# Patient Record
Sex: Male | Born: 1990 | Race: Black or African American | Hispanic: No | Marital: Single | Smoking: Never smoker
Health system: Southern US, Community
[De-identification: ages and names within clinical notes are randomized; demographics above are authoritative.]

## PROBLEM LIST (undated history)

## (undated) DIAGNOSIS — K603 Anal fistula, unspecified: Secondary | ICD-10-CM

---

## 2012-11-01 ENCOUNTER — Encounter (HOSPITAL_COMMUNITY): Payer: Self-pay | Admitting: Emergency Medicine

## 2012-11-01 ENCOUNTER — Emergency Department (HOSPITAL_COMMUNITY): Payer: Managed Care, Other (non HMO)

## 2012-11-01 ENCOUNTER — Inpatient Hospital Stay (HOSPITAL_COMMUNITY)
Admission: EM | Admit: 2012-11-01 | Discharge: 2012-11-03 | DRG: 348 | Disposition: A | Discharge status: Home / Self Care | Payer: Managed Care, Other (non HMO) | Attending: General Surgery | Admitting: General Surgery

## 2012-11-01 DIAGNOSIS — K603 Anal fistula, unspecified: Principal | ICD-10-CM

## 2012-11-01 DIAGNOSIS — K612 Anorectal abscess: Secondary | ICD-10-CM | POA: Diagnosis present

## 2012-11-01 LAB — CBC WITH DIFFERENTIAL/PLATELET
Basophils Absolute: 0 10*3/uL (ref 0.0–0.1)
Eosinophils Absolute: 0 10*3/uL (ref 0.0–0.7)
HCT: 38.2 % — ABNORMAL LOW (ref 39.0–52.0)
Hemoglobin: 13.5 g/dL (ref 13.0–17.0)
Lymphs Abs: 1.3 10*3/uL (ref 0.7–4.0)
MCH: 26.4 pg (ref 26.0–34.0)
MCHC: 35.3 g/dL (ref 30.0–36.0)
MCV: 74.8 fL — ABNORMAL LOW (ref 78.0–100.0)
Monocytes Absolute: 0.9 10*3/uL (ref 0.1–1.0)
Monocytes Relative: 8 % (ref 3–12)
Neutro Abs: 9.6 10*3/uL — ABNORMAL HIGH (ref 1.7–7.7)
Platelets: 224 10*3/uL (ref 150–400)
RDW: 13.5 % (ref 11.5–15.5)
WBC: 11.8 10*3/uL — ABNORMAL HIGH (ref 4.0–10.5)

## 2012-11-01 LAB — BASIC METABOLIC PANEL
CO2: 26 mEq/L (ref 19–32)
Calcium: 8.8 mg/dL (ref 8.4–10.5)
Creatinine, Ser: 0.96 mg/dL (ref 0.50–1.35)
GFR calc Af Amer: >90 mL/min (ref >90)
GFR calc non Af Amer: >90 mL/min (ref >90)
Sodium: 137 mEq/L (ref 135–145)

## 2012-11-01 MED ORDER — IOHEXOL 300 MG/ML  SOLN
25.0000 mL | INTRAMUSCULAR | Status: AC
  Administered 2012-11-01: 25 mL via ORAL

## 2012-11-01 MED ORDER — MORPHINE SULFATE 4 MG/ML IJ SOLN
4.0000 mg | Freq: Once | INTRAMUSCULAR | Status: AC
  Administered 2012-11-01: 4 mg via INTRAVENOUS
  Filled 2012-11-01: qty 1

## 2012-11-01 MED ORDER — ONDANSETRON HCL 4 MG/2ML IJ SOLN
4.0000 mg | Freq: Once | INTRAMUSCULAR | Status: AC
  Administered 2012-11-01: 4 mg via INTRAVENOUS
  Filled 2012-11-01: qty 2

## 2012-11-01 MED ORDER — IOHEXOL 300 MG/ML  SOLN
100.0000 mL | Freq: Once | INTRAMUSCULAR | Status: AC | PRN
  Administered 2012-11-01: 100 mL via INTRAVENOUS

## 2012-11-01 NOTE — ED Notes (Signed)
CT notified that pt is finished with contrast.  

## 2012-11-01 NOTE — ED Notes (Signed)
Patient here for complaint of draining wound to buttocks. Wound observed, L inner cheek area. Wound bed is pink with dried white drainage. No active drainage noted, edema present. Patient states he has had this for six months and already got it treated. Patient observed picking at it with his fingers. Pt informed not to touch wound with his own fingers. Patient states he decided to come in today because it was draining down his pants at work.

## 2012-11-01 NOTE — ED Provider Notes (Signed)
CSN: [id number]     Arrival date & time 11/01/12  1906 History  This chart was scribed for Bobby Morn, NP, working with Bobby Houston. Rubin Payor, MD, by Allene Dillon, ED Scribe. This patient was seen in room TR10C/TR10C and the patient's care was started at 8:17 PM.    Chief Complaint  Patient presents with  . Abscess    Patient is a 22 y.o. male presenting with abscess. The history is provided by the patient. No language interpreter was used.  Abscess Location:  Ano-genital Ano-genital abscess location:  Perineum Red streaking: no   Progression:  Worsening Chronicity:  Recurrent Relieved by:  Nothing Worsened by:  Nothing tried Ineffective treatments:  None tried Associated symptoms: no fever, no nausea and no vomiting   Risk factors: prior abscess    HPI Comments: Bobby Houston is a 22 y.o. male who presents to the Emergency Department complaining of abscess at the base of the perineum which has been an ongoing problem for 6 months. Pt has associated abdominal pain. Pt has previously had abscess drained in [place]. Pt has sometimes brown, sometimes clear drainage and bleeding.  Pt denies any other symptoms.     History reviewed. No pertinent past medical history. History reviewed. No pertinent past surgical history. History reviewed. No pertinent family history. History  Substance Use Topics  . Smoking status: Never Smoker   . Smokeless tobacco: Not on file  . Alcohol Use: 14.4 oz/week    24 Cans of beer per week    Review of Systems  Constitutional: Negative for fever and chills.  Gastrointestinal: Negative for nausea and vomiting.  Skin: Positive for wound (abscess near perineum ).  Neurological: Negative for numbness.  All other systems reviewed and are negative.    Allergies  Review of patient's allergies indicates no known allergies.  Home Medications  No current outpatient prescriptions on file.  Triage Vitals: BP 141/91  Pulse 84  Temp(Src) 99.5 F  (37.5 C) (Oral)  Resp 16  SpO2 99% Physical Exam  Nursing note and vitals reviewed. Constitutional: He is oriented to person, place, and time. He appears well-developed and well-nourished. No distress.  HENT:  Head: Normocephalic.  Eyes: EOM are normal.  Neck: Normal range of motion.  Cardiovascular: Normal rate, regular rhythm and normal heart sounds.   Pulmonary/Chest: Effort normal and breath sounds normal. No respiratory distress.  Abdominal: Soft. He exhibits no distension.  Musculoskeletal: Normal range of motion.  Neurological: He is alert and oriented to person, place, and time.  Skin: Skin is warm and dry.  Large indurated area around perineum   Psychiatric: He has a normal mood and affect.    ED Course  Procedures (including critical care time) DIAGNOSTIC STUDIES: Oxygen Saturation is 99% on RA, normal by my interpretation.    COORDINATION OF CARE: 8:20 PM- Pt advised of plan for treatment which includes diagnostics radiology and lab work and pt agrees.  Medications  iohexol (OMNIPAQUE) 300 MG/ML solution 25 mL (25 mLs Oral Contrast Given 11/01/12 2129)  morphine 4 MG/ML injection 4 mg (not administered)  morphine 4 MG/ML injection 4 mg (4 mg Intravenous Given 11/01/12 2101)  ondansetron (ZOFRAN) injection 4 mg (4 mg Intravenous Given 11/01/12 2101)  iohexol (OMNIPAQUE) 300 MG/ML solution 100 mL (100 mLs Intravenous Contrast Given 11/01/12 2321)    Labs Review Labs Reviewed  CBC WITH DIFFERENTIAL - Abnormal; Notable for the following:    WBC 11.8 (*)    HCT 38.2 (*)  MCV 74.8 (*)    Neutrophils Relative % 81 (*)    Lymphocytes Relative 11 (*)    Neutro Abs 9.6 (*)    All other components within normal limits  BASIC METABOLIC PANEL   Imaging Review No results found. CT results reviewed, discussed with Dr. Rubin Payor, shared with patient. 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 Surgery consulted, they will see and admit. MDM    I  personally performed the services described in this documentation, which was scribed in my presence. The recorded information has been reviewed and is accurate.    Jimmye Norman, NP 11/02/12 [phone]

## 2012-11-01 NOTE — ED Notes (Signed)
Phlebotomy called about labs, patient updated on delay.
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
## 2012-11-01 NOTE — ED Notes (Signed)
Patient has been cursing at nursing staff and stated "man yall slow as fuck where is my fucking pain medicine." Patient reassured and delay explained.

## 2012-11-01 NOTE — ED Notes (Signed)
Pt complaint of abscess to anus.

## 2012-11-02 ENCOUNTER — Inpatient Hospital Stay (HOSPITAL_COMMUNITY): Payer: Managed Care, Other (non HMO) | Admitting: Anesthesiology

## 2012-11-02 ENCOUNTER — Encounter (HOSPITAL_COMMUNITY): Admission: EM | Disposition: A | Discharge status: Home / Self Care | Payer: Self-pay | Source: Home / Self Care

## 2012-11-02 ENCOUNTER — Encounter (HOSPITAL_COMMUNITY): Payer: Self-pay | Admitting: Anesthesiology

## 2012-11-02 DIAGNOSIS — K603 Anal fistula: Secondary | ICD-10-CM

## 2012-11-02 DIAGNOSIS — K612 Anorectal abscess: Secondary | ICD-10-CM

## 2012-11-02 HISTORY — PX: IRRIGATION AND DEBRIDEMENT ABSCESS: SHX5252

## 2012-11-02 HISTORY — PX: EXAMINATION UNDER ANESTHESIA: SHX1540

## 2012-11-02 HISTORY — PX: PLACEMENT OF SETON: SHX6029

## 2012-11-02 LAB — SURGICAL PCR SCREEN: MRSA, PCR: NEGATIVE

## 2012-11-02 SURGERY — IRRIGATION AND DEBRIDEMENT ABSCESS
Anesthesia: General | Site: Anus | Wound class: Dirty or Infected

## 2012-11-02 MED ORDER — MUPIROCIN 2 % EX OINT
1.0000 "application " | TOPICAL_OINTMENT | Freq: Two times a day (BID) | CUTANEOUS | Status: DC
  Administered 2012-11-02 – 2012-11-03 (×2): 1 via NASAL
  Filled 2012-11-02 (×2): qty 22

## 2012-11-02 MED ORDER — HYDROMORPHONE HCL PF 1 MG/ML IJ SOLN
INTRAMUSCULAR | Status: AC
  Filled 2012-11-02: qty 1

## 2012-11-02 MED ORDER — BUPIVACAINE-EPINEPHRINE PF 0.25-1:200000 % IJ SOLN
INTRAMUSCULAR | Status: AC
  Filled 2012-11-02: qty 30

## 2012-11-02 MED ORDER — ONDANSETRON HCL 4 MG/2ML IJ SOLN: 4.0000 mg | Freq: Four times a day (QID) | INTRAMUSCULAR | Status: DC | PRN

## 2012-11-02 MED ORDER — HYDROMORPHONE HCL PF 1 MG/ML IJ SOLN
1.0000 mg | INTRAMUSCULAR | Status: DC | PRN
  Administered 2012-11-02 – 2012-11-03 (×2): 1 mg via INTRAVENOUS
  Filled 2012-11-02 (×3): qty 1

## 2012-11-02 MED ORDER — ACETAMINOPHEN 325 MG PO TABS
650.0000 mg | ORAL_TABLET | Freq: Four times a day (QID) | ORAL | Status: DC | PRN
  Administered 2012-11-02: 650 mg via ORAL
  Filled 2012-11-02: qty 2

## 2012-11-02 MED ORDER — SURGILUBE EX GEL
CUTANEOUS | Status: DC | PRN
  Administered 2012-11-02: 1 via TOPICAL

## 2012-11-02 MED ORDER — LACTATED RINGERS IV SOLN
INTRAVENOUS | Status: DC | PRN
  Administered 2012-11-02: 11:00:00 via INTRAVENOUS

## 2012-11-02 MED ORDER — OXYCODONE HCL 5 MG/5ML PO SOLN: 5.0000 mg | Freq: Once | ORAL | Status: DC | PRN

## 2012-11-02 MED ORDER — LIDOCAINE HCL (CARDIAC) 20 MG/ML IV SOLN
INTRAVENOUS | Status: DC | PRN
  Administered 2012-11-02: 100 mg via INTRAVENOUS

## 2012-11-02 MED ORDER — ENOXAPARIN SODIUM 40 MG/0.4ML ~~LOC~~ SOLN
40.0000 mg | SUBCUTANEOUS | Status: DC
  Administered 2012-11-03: 40 mg via SUBCUTANEOUS
  Filled 2012-11-02 (×2): qty 0.4

## 2012-11-02 MED ORDER — SODIUM CHLORIDE 0.9 % IV SOLN
3.0000 g | Freq: Four times a day (QID) | INTRAVENOUS | Status: DC
  Administered 2012-11-02 – 2012-11-03 (×6): 3 g via INTRAVENOUS
  Filled 2012-11-02 (×8): qty 3

## 2012-11-02 MED ORDER — HYDROGEN PEROXIDE 3 % EX SOLN
CUTANEOUS | Status: DC | PRN
  Administered 2012-11-02: 1

## 2012-11-02 MED ORDER — LACTATED RINGERS IV SOLN
INTRAVENOUS | Status: DC
  Administered 2012-11-02: 11:00:00 via INTRAVENOUS

## 2012-11-02 MED ORDER — BUPIVACAINE-EPINEPHRINE 0.25% -1:200000 IJ SOLN
INTRAMUSCULAR | Status: DC | PRN
  Administered 2012-11-02: 10 mL

## 2012-11-02 MED ORDER — HYDROMORPHONE HCL PF 1 MG/ML IJ SOLN
0.2500 mg | INTRAMUSCULAR | Status: DC | PRN
  Administered 2012-11-02 (×4): 0.5 mg via INTRAVENOUS

## 2012-11-02 MED ORDER — HYDROMORPHONE HCL PF 1 MG/ML IJ SOLN
1.0000 mg | INTRAMUSCULAR | Status: DC | PRN
  Administered 2012-11-02 (×3): 1 mg via INTRAVENOUS
  Filled 2012-11-02 (×3): qty 1

## 2012-11-02 MED ORDER — PROPOFOL 10 MG/ML IV BOLUS
INTRAVENOUS | Status: DC | PRN
  Administered 2012-11-02: 200 mg via INTRAVENOUS

## 2012-11-02 MED ORDER — DOCUSATE SODIUM 100 MG PO CAPS
100.0000 mg | ORAL_CAPSULE | Freq: Two times a day (BID) | ORAL | Status: DC
  Administered 2012-11-02 – 2012-11-03 (×2): 100 mg via ORAL
  Filled 2012-11-02 (×2): qty 1

## 2012-11-02 MED ORDER — FENTANYL CITRATE 0.05 MG/ML IJ SOLN
INTRAMUSCULAR | Status: DC | PRN
  Administered 2012-11-02: 50 ug via INTRAVENOUS
  Administered 2012-11-02: 150 ug via INTRAVENOUS

## 2012-11-02 MED ORDER — ACETAMINOPHEN 650 MG RE SUPP: 650.0000 mg | Freq: Four times a day (QID) | RECTAL | Status: DC | PRN

## 2012-11-02 MED ORDER — OXYCODONE HCL 5 MG PO TABS: 5.0000 mg | ORAL_TABLET | Freq: Once | ORAL | Status: DC | PRN

## 2012-11-02 MED ORDER — DEXTROSE-NACL 5-0.9 % IV SOLN
INTRAVENOUS | Status: DC
  Administered 2012-11-02 – 2012-11-03 (×3): via INTRAVENOUS

## 2012-11-02 MED ORDER — OXYCODONE-ACETAMINOPHEN 5-325 MG PO TABS
ORAL_TABLET | ORAL | Status: AC
  Filled 2012-11-02: qty 2

## 2012-11-02 MED ORDER — OXYCODONE-ACETAMINOPHEN 5-325 MG PO TABS
1.0000 | ORAL_TABLET | ORAL | Status: DC | PRN
  Administered 2012-11-02 (×2): 2 via ORAL
  Filled 2012-11-02: qty 2

## 2012-11-02 MED ORDER — ONDANSETRON HCL 4 MG/2ML IJ SOLN
INTRAMUSCULAR | Status: DC | PRN
  Administered 2012-11-02: 4 mg via INTRAVENOUS

## 2012-11-02 MED ORDER — 0.9 % SODIUM CHLORIDE (POUR BTL) OPTIME
TOPICAL | Status: DC | PRN
  Administered 2012-11-02: 1000 mL

## 2012-11-02 MED ORDER — MIDAZOLAM HCL 5 MG/5ML IJ SOLN
INTRAMUSCULAR | Status: DC | PRN
  Administered 2012-11-02: 2 mg via INTRAVENOUS

## 2012-11-02 MED ORDER — PROMETHAZINE HCL 25 MG/ML IJ SOLN: 6.2500 mg | INTRAMUSCULAR | Status: DC | PRN

## 2012-11-02 MED ORDER — ENOXAPARIN SODIUM 40 MG/0.4ML ~~LOC~~ SOLN: 40.0000 mg | SUBCUTANEOUS | Status: DC

## 2012-11-02 MED ORDER — SUCCINYLCHOLINE CHLORIDE 20 MG/ML IJ SOLN
INTRAMUSCULAR | Status: DC | PRN
  Administered 2012-11-02: 100 mg via INTRAVENOUS

## 2012-11-02 MED ORDER — CHLORHEXIDINE GLUCONATE CLOTH 2 % EX PADS
6.0000 | MEDICATED_PAD | Freq: Every day | CUTANEOUS | Status: DC
  Administered 2012-11-02: 6 via TOPICAL

## 2012-11-02 SURGICAL SUPPLY — 41 items
BAG DECANTER FOR FLEXI CONT (MISCELLANEOUS) ×2 IMPLANT
BENZOIN TINCTURE PRP APPL 2/3 (GAUZE/BANDAGES/DRESSINGS) ×2 IMPLANT
BLADE SURG ROTATE 9660 (MISCELLANEOUS) IMPLANT
CANISTER SUCTION 2500CC (MISCELLANEOUS) ×2 IMPLANT
CHLORAPREP W/TINT 26ML (MISCELLANEOUS) ×2 IMPLANT
CLOTH BEACON ORANGE TIMEOUT ST (SAFETY) ×2 IMPLANT
COVER SURGICAL LIGHT HANDLE (MISCELLANEOUS) ×2 IMPLANT
DECANTER SPIKE VIAL GLASS SM (MISCELLANEOUS) ×2 IMPLANT
DRAPE LAPAROTOMY T 102X78X121 (DRAPES) ×4 IMPLANT
DRAPE UTILITY 15X26 W/TAPE STR (DRAPE) ×4 IMPLANT
DRSG PAD ABDOMINAL 8X10 ST (GAUZE/BANDAGES/DRESSINGS) ×2 IMPLANT
ELECT REM PT RETURN 9FT ADLT (ELECTROSURGICAL) ×2
ELECTRODE REM PT RTRN 9FT ADLT (ELECTROSURGICAL) ×1 IMPLANT
GLOVE BIOGEL M STRL SZ7.5 (GLOVE) ×2 IMPLANT
GLOVE BIOGEL PI IND STRL 7.5 (GLOVE) ×1 IMPLANT
GLOVE BIOGEL PI IND STRL 8 (GLOVE) ×1 IMPLANT
GLOVE BIOGEL PI INDICATOR 7.5 (GLOVE) ×1
GLOVE BIOGEL PI INDICATOR 8 (GLOVE) ×1
GLOVE SURG SS PI 7.0 STRL IVOR (GLOVE) ×2 IMPLANT
GOWN STRL NON-REIN LRG LVL3 (GOWN DISPOSABLE) ×2 IMPLANT
GOWN STRL REIN XL XLG (GOWN DISPOSABLE) ×2 IMPLANT
HYDROGEN PEROXIDE 16OZ (MISCELLANEOUS) ×2 IMPLANT
IV CATH 14GX2 1/4 (CATHETERS) ×2 IMPLANT
KIT BASIN OR (CUSTOM PROCEDURE TRAY) ×2 IMPLANT
KIT ROOM TURNOVER OR (KITS) ×2 IMPLANT
LOOP VESSEL MINI RED (MISCELLANEOUS) ×2 IMPLANT
NEEDLE HYPO 25GX1X1/2 BEV (NEEDLE) ×2 IMPLANT
NS IRRIG 1000ML POUR BTL (IV SOLUTION) ×2 IMPLANT
PACK GENERAL/GYN (CUSTOM PROCEDURE TRAY) ×2 IMPLANT
PAD ARMBOARD 7.5X6 YLW CONV (MISCELLANEOUS) ×4 IMPLANT
SPECIMEN JAR SMALL (MISCELLANEOUS) ×2 IMPLANT
SPONGE GAUZE 4X4 12PLY (GAUZE/BANDAGES/DRESSINGS) ×2 IMPLANT
SUT ETHILON 3 0 PS 1 (SUTURE) ×2 IMPLANT
SUT VIC AB 3-0 SH 18 (SUTURE) ×2 IMPLANT
SYR BULB 3OZ (MISCELLANEOUS) ×2 IMPLANT
SYR CONTROL 10ML LL (SYRINGE) ×4 IMPLANT
TAPE CLOTH SURG 6X10 WHT LF (GAUZE/BANDAGES/DRESSINGS) ×2 IMPLANT
TOWEL OR 17X24 6PK STRL BLUE (TOWEL DISPOSABLE) ×2 IMPLANT
TOWEL OR 17X26 10 PK STRL BLUE (TOWEL DISPOSABLE) ×2 IMPLANT
UNDERPAD 30X30 INCONTINENT (UNDERPADS AND DIAPERS) ×2 IMPLANT
WATER STERILE IRR 1000ML POUR (IV SOLUTION) IMPLANT
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
## 2012-11-02 NOTE — Progress Notes (Signed)
Patient arrived to unit via stretcher. Alert and oriented. VSS. No drainage from buttock wound. Pain meds given in ED just before arrival to unit. Oriented to room and surroundings.

## 2012-11-02 NOTE — Preoperative (Signed)
Beta Blockers   Reason not to administer Beta Blockers:Not Applicable 

## 2012-11-02 NOTE — Progress Notes (Signed)
Subjective: Reports less pain this am. Reports 41mo h/o intermittent L buttock pain, swelling with intermittent drainage. BMs generally every other day. No melena/hematochezia, wt loss. Denies h/o IBD. Denies other medical problems  Objective: Vital signs in last 24 hours: Temp:  [98.3 F (36.8 C)-100 F (37.8 C)] 98.3 F (36.8 C) (09/24 0534) Pulse Rate:  [70-89] 70 (09/24 0534) Resp:  [16-18] 16 (09/24 0534) BP: (98-141)/(38-91) 98/38 mmHg (09/24 0534) SpO2:  [96 %-100 %] 99 % (09/24 0534) Weight:  [197 lb 1.5 oz (89.4 kg)] 197 lb 1.5 oz (89.4 kg) (09/24 0220) Last BM Date: 11/01/12  Intake/Output from previous day: 09/23 0701 - 09/24 0700 In: 100 [IV Piggyback:100] Out: -  Intake/Output this shift:    Alert, nad cta  Reg Soft, nt Rectal - no change from Dr Cornett's exam - several openings on inner L buttock with induration,fluctuance. DRE - good tone. Very tender  Lab Results:   Recent Labs  11/01/12 2135  WBC 11.8*  HGB 13.5  HCT 38.2*  PLT 224   BMET  Recent Labs  11/01/12 2135  NA 137  K 3.5  CL 100  CO2 26  GLUCOSE 87  BUN 10  CREATININE 0.96  CALCIUM 8.8   PT/INR No results found for this basename: LABPROT, INR,  in the last 72 hours ABG No results found for this basename: PHART, PCO2, PO2, HCO3,  in the last 72 hours  Studies/Results: Ct Pelvis W Contrast  11/01/2012   CLINICAL DATA:  Recurring perianal abscess. History of the surgical drainage.  EXAM: CT PELVIS WITH CONTRAST  TECHNIQUE: Multidetector CT imaging of the pelvis was performed using the standard protocol following the bolus administration of intravenous contrast.  CONTRAST:  OMNIPAQUE IOHEXOL 300 MG/ML  SOLN  COMPARISON:  None.  FINDINGS: There is a perianal fistula on the left at 5 o'clock with soft tissue stranding in the surrounding subcutaneous fat. There is a small associated fluid collection measuring 2.3 cm maximally. Air extends from the anus to perineal skin surface.  No significant inflammatory changes are present on the right.  The distal rectum and sigmoid colon appear normal. There are no intrapelvic inflammatory changes or fluid collections. The appendix appears normal. There is no pelvic or inguinal lymphadenopathy.  The urinary bladder, prostate gland and seminal vesicles appear normal. There are no significant vascular findings.  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 No intrapelvic inflammatory change is demonstrated.   Electronically Signed   By: Roxy Horseman   On: 11/01/2012 23:59    Anti-infectives: Anti-infectives   Start     Dose/Rate Route Frequency Ordered Stop   11/02/12 0100  Ampicillin-Sulbactam (UNASYN) 3 g in sodium chloride 0.9 % 100 mL IVPB     3 g 100 mL/hr over 60 Minutes Intravenous Every 6 hours 11/02/12 0056        Assessment/Plan: L Perirectal abscess with high suspicion for fistula in ano given clinical history and CT  NPO IVF & IV abx scds rec OR for EUA, I&D, possible seton placement  Discussed perirectal abscess and high suspicion of fistula. Explained we needed to control infection with I&D. Will also evaluate for fistula - if found recommended seton placement. Explained that sometimes fistula may be present but can't ID at time of surgery given degree of inflammation. Discussed risk/benefits of surgery   including but not limited to bleeding, infection, injury to surrounding structures, blood clot formation, urinary retention, anesthesia  risks, pulmonary & cardiac complications, need for additional procedures. I explained that he would most likely need additional procedures in future to repair fistula. I explained that the likelihood of improvement in their symptoms is good.  Mary Sella. Andrey Campanile, MD, FACS General, Bariatric, & Minimally Invasive Surgery [facility] Surgery, [place]   LOS: 1 day    Atilano Ina 11/02/2012

## 2012-11-02 NOTE — Progress Notes (Signed)
Patient had a bowel movement and dressing had come off around 2115. No packing noted inside incision. Dry dressing applied over incision with tape.

## 2012-11-02 NOTE — Anesthesia Preprocedure Evaluation (Signed)
Anesthesia Evaluation  Patient identified by MRN, date of birth, ID band Patient awake    Reviewed: Allergy & Precautions, H&P , NPO status , Patient's Chart, lab work & pertinent test results  Airway Mallampati: I TM Distance: >3 FB Neck ROM: Full    Dental  (+) Teeth Intact and Dental Advisory Given   Pulmonary neg pulmonary ROS,    Pulmonary exam normal       Cardiovascular negative cardio ROS      Neuro/Psych negative neurological ROS  negative psych ROS   GI/Hepatic negative GI ROS, Neg liver ROS,   Endo/Other  negative endocrine ROS  Renal/GU negative Renal ROS  negative genitourinary   Musculoskeletal negative musculoskeletal ROS (+)   Abdominal   Peds negative pediatric ROS (+)  Hematology negative hematology ROS (+)   Anesthesia Other Findings   Reproductive/Obstetrics negative OB ROS                           Anesthesia Physical Anesthesia Plan  ASA: II and emergent  Anesthesia Plan: General   Post-op Pain Management:    Induction: Intravenous  Airway Management Planned: Oral ETT  Additional Equipment:   Intra-op Plan:   Post-operative Plan: Extubation in OR  Informed Consent: I have reviewed the patients History and Physical, chart, labs and discussed the procedure including the risks, benefits and alternatives for the proposed anesthesia with the patient or authorized representative who has indicated his/her understanding and acceptance.   Dental advisory given  Plan Discussed with: CRNA, Anesthesiologist and Surgeon  Anesthesia Plan Comments:         Anesthesia Quick Evaluation

## 2012-11-02 NOTE — Transfer of Care (Signed)
Immediate Anesthesia Transfer of Care Note  Patient: Bobby Houston  Procedure(s) Performed: Procedure(s): IRRIGATION AND DEBRIDEMENT RIGHT BUTTOCK BSCESS (N/A) PLACEMENT OF SETON (N/A) EXAM UNDER ANESTHESIA (N/A)  Patient Location: PACU  Anesthesia Type:General  Level of Consciousness: awake, alert  and oriented  Airway & Oxygen Therapy: Patient Spontanous Breathing and Patient connected to nasal cannula oxygen  Post-op Assessment: Report given to PACU RN and Post -op Vital signs reviewed and stable  Post vital signs: Reviewed and stable  Complications: No apparent anesthesia complications

## 2012-11-02 NOTE — Op Note (Signed)
11/01/2012 - 11/02/2012  3:58 PM  PATIENT:  Bobby Houston  22 y.o. male  PRE-OPERATIVE DIAGNOSIS:  Left buttock abscess, probable fistula in ano  POST-OPERATIVE DIAGNOSIS: Left buttock abscess, fistula in ano  PROCEDURE:  Procedure(s): IRRIGATION AND DEBRIDEMENT RIGHT BUTTOCK ABSCESS (skin and subcutaneous tissue with scalpel) PLACEMENT OF SETON EXAM UNDER ANESTHESIA  SURGEON:  Surgeon(s): Atilano Ina, MD  ASSISTANTS: none   ANESTHESIA:   general  DRAINS: seton   LOCAL MEDICATIONS USED:  MARCAINE     SPECIMEN:  Source of Specimen:  buttock skin and subcu tissue  DISPOSITION OF SPECIMEN:  PATHOLOGY  COUNTS:  YES  INDICATION FOR PROCEDURE: Patient is a 22 year old African American male who reports a six-month history of intermittent perirectal drainage. He states occasionally he will develop tenderness and swelling on his left buttock and it'll start to spontaneously drain. It will drain for several days and then stop. He has had several flares over the past couple months. He states a few days ago the swelling became very intense and painful more so then in the past And he presented to the emergency room. He underwent CT scan which demonstrated a left buttock abscess as well as a probable fistula on the left side. I discussed with the patient going to the operating room for exam under anesthesia, debrider the buttock abscess and to evaluate for a potential fistula. Based on his clinical history it was very clear that he probably has a fistula in ano. I explained that if I was able to demonstrate it I would recommend placing a seton. I explained that sometimes in the acute inflamed state it may not be evident on today's exam. We discussed the risk and benefits of surgery including but not limited to bleeding, infection, injury to surrounding structures, urinary retention, blood clot formation, need for additional procedures, recurrence, anesthesia concerns, as well as the typical  postoperative recovery course  PROCEDURE: After obtaining informed consent the patient was taken to the operating room [facility] In general endotracheal anesthesia was established. Sequential compression devices were placed. He was placed in the prone jackknife position with his buttocks taped apart. His buttocks and anus was prepped and draped with Betadine. A surgical timeout was performed. The patient was on therapeutic antibiotics  The patient had an opening in his left buttock in the anterior position at the 7:00 position. There were several chronic sinuses in this location. the area was very indurated. Digital rectal exam was performed followed by anoscopy. In the anal canal in the left anterior area there appeared to be some induration of the anal mucosa just inside the anal verge. Using a scalpel, I excised the area of induration in the left buttock, excising the entire area of sinuses. The skin and subcutaneous tissue was excised. Using a 10 cc syringe with a blunt Angiocath cannula injected hydrogen peroxide into the cavity I was able to visualize the hydrogen peroxide coming out of the anus about 1 cm from anal verge in the left anterior position at 7 o'clock. This confirmed the presence of a fistula in ano. A lacrimal duct probe was then advanced through the open cavity in the left buttock and up through the tract to the opening inside the anus. I then passed a vessel loop and secured it to itself. The left buttock was packed with moist gauze followed by pads. 4 x 4's ABDs and mesh underwear were then applied.  The patient was placed in the supine position, extubated,  and taken to the recovery room in stable condition. There are no immediate complications  PLAN OF CARE: PACU then return to floor  PATIENT DISPOSITION:  PACU - hemodynamically stable.   Delay start of Pharmacological VTE agent (>24hrs) due to surgical blood loss or risk of bleeding:  no  Mary Sella. Andrey Campanile, MD,  FACS General, Bariatric, & Minimally Invasive Surgery [facility] Surgery, [place]

## 2012-11-03 ENCOUNTER — Encounter (HOSPITAL_COMMUNITY): Payer: Self-pay | Admitting: General Surgery

## 2012-11-03 ENCOUNTER — Inpatient Hospital Stay (HOSPITAL_COMMUNITY)
Admission: EM | Admit: 2012-11-03 | Discharge: 2012-11-04 | Disposition: A | Discharge status: Home / Self Care | Payer: Managed Care, Other (non HMO) | Source: Home / Self Care | Attending: General Surgery | Admitting: General Surgery

## 2012-11-03 DIAGNOSIS — R112 Nausea with vomiting, unspecified: Secondary | ICD-10-CM

## 2012-11-03 DIAGNOSIS — R1110 Vomiting, unspecified: Secondary | ICD-10-CM

## 2012-11-03 DIAGNOSIS — K603 Anal fistula: Secondary | ICD-10-CM

## 2012-11-03 DIAGNOSIS — K6289 Other specified diseases of anus and rectum: Secondary | ICD-10-CM

## 2012-11-03 LAB — CBC WITH DIFFERENTIAL/PLATELET
Eosinophils Absolute: 0.1 10*3/uL (ref 0.0–0.7)
Eosinophils Relative: 1 % (ref 0–5)
Hemoglobin: 12.9 g/dL — ABNORMAL LOW (ref 13.0–17.0)
Lymphocytes Relative: 18 % (ref 12–46)
Lymphs Abs: 1.8 10*3/uL (ref 0.7–4.0)
Monocytes Relative: 9 % (ref 3–12)
Neutro Abs: 7.3 10*3/uL (ref 1.7–7.7)
Neutrophils Relative %: 72 % (ref 43–77)
Platelets: 214 10*3/uL (ref 150–400)
RBC: 4.99 MIL/uL (ref 4.22–5.81)
WBC: 10.2 10*3/uL (ref 4.0–10.5)

## 2012-11-03 LAB — BASIC METABOLIC PANEL
BUN: 5 mg/dL — ABNORMAL LOW (ref 6–23)
CO2: 28 mEq/L (ref 19–32)
Calcium: 9.2 mg/dL (ref 8.4–10.5)
Chloride: 101 mEq/L (ref 96–112)
GFR calc Af Amer: >90 mL/min (ref >90)
GFR calc non Af Amer: >90 mL/min (ref >90)
Glucose, Bld: 114 mg/dL — ABNORMAL HIGH (ref 70–99)
Potassium: 3.3 mEq/L — ABNORMAL LOW (ref 3.5–5.1)
Sodium: 138 mEq/L (ref 135–145)

## 2012-11-03 MED ORDER — DOCUSATE SODIUM 100 MG PO CAPS
100.0000 mg | ORAL_CAPSULE | Freq: Two times a day (BID) | ORAL | Status: DC
  Administered 2012-11-04: 100 mg via ORAL
  Filled 2012-11-03 (×3): qty 1

## 2012-11-03 MED ORDER — MAGNESIUM HYDROXIDE 400 MG/5ML PO SUSP: 30.0000 mL | Freq: Every day | ORAL | Status: DC

## 2012-11-03 MED ORDER — PIPERACILLIN-TAZOBACTAM 3.375 G IVPB
3.3750 g | Freq: Three times a day (TID) | INTRAVENOUS | Status: DC
  Administered 2012-11-04 (×2): 3.375 g via INTRAVENOUS
  Filled 2012-11-03 (×3): qty 50

## 2012-11-03 MED ORDER — KCL-LACTATED RINGERS-D5W 20 MEQ/L IV SOLN
INTRAVENOUS | Status: DC
  Administered 2012-11-03: 100 mL/h via INTRAVENOUS
  Filled 2012-11-03 (×2): qty 1000

## 2012-11-03 MED ORDER — ONDANSETRON HCL 4 MG/2ML IJ SOLN: 4.0000 mg | Freq: Four times a day (QID) | INTRAMUSCULAR | Status: DC | PRN

## 2012-11-03 MED ORDER — HYDROMORPHONE HCL PF 1 MG/ML IJ SOLN
1.0000 mg | Freq: Once | INTRAMUSCULAR | Status: AC
  Administered 2012-11-03: 1 mg via INTRAVENOUS
  Filled 2012-11-03: qty 1

## 2012-11-03 MED ORDER — POLYETHYLENE GLYCOL 3350 17 GM/SCOOP PO POWD: 8.5000 g | Freq: Every day | ORAL | Status: DC

## 2012-11-03 MED ORDER — HYDROMORPHONE HCL PF 1 MG/ML IJ SOLN
0.5000 mg | INTRAMUSCULAR | Status: DC | PRN
  Administered 2012-11-03 – 2012-11-04 (×2): 1 mg via INTRAVENOUS
  Filled 2012-11-03 (×2): qty 1

## 2012-11-03 MED ORDER — AMOXICILLIN-POT CLAVULANATE 875-125 MG PO TABS: 1.0000 | ORAL_TABLET | Freq: Two times a day (BID) | ORAL | Status: DC

## 2012-11-03 MED ORDER — OXYCODONE HCL 10 MG PO TABS: 10.0000 mg | ORAL_TABLET | ORAL | Status: DC | PRN

## 2012-11-03 MED ORDER — ONDANSETRON HCL 4 MG/2ML IJ SOLN
4.0000 mg | Freq: Once | INTRAMUSCULAR | Status: AC
  Administered 2012-11-03: 4 mg via INTRAVENOUS
  Filled 2012-11-03: qty 2

## 2012-11-03 MED ORDER — ENOXAPARIN SODIUM 40 MG/0.4ML ~~LOC~~ SOLN
40.0000 mg | SUBCUTANEOUS | Status: DC
  Filled 2012-11-03 (×2): qty 0.4

## 2012-11-03 MED ORDER — MORPHINE SULFATE 4 MG/ML IJ SOLN: 4.0000 mg | Freq: Once | INTRAMUSCULAR | Status: DC

## 2012-11-03 MED ORDER — DSS 100 MG PO CAPS: 100.0000 mg | ORAL_CAPSULE | Freq: Two times a day (BID) | ORAL | Status: DC

## 2012-11-03 MED ORDER — CHLORHEXIDINE GLUCONATE CLOTH 2 % EX PADS: 6.0000 | MEDICATED_PAD | Freq: Every day | CUTANEOUS | Status: DC

## 2012-11-03 MED ORDER — SODIUM CHLORIDE 0.9 % IV BOLUS (SEPSIS)
1000.0000 mL | Freq: Once | INTRAVENOUS | Status: AC
  Administered 2012-11-03: 1000 mL via INTRAVENOUS

## 2012-11-03 NOTE — H&P (Signed)
Bobby Houston is an 22 y.o. male.   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 He has been unable to control his pain at home and has had chills. He's also had nausea and vomiting. He presented back to the [facility] for further evaluation and treatment.  History reviewed. No pertinent past medical history.  Past Surgical History  Procedure Laterality Date  . Irrigation and debridement abscess N/A 11/02/2012    Procedure: IRRIGATION AND DEBRIDEMENT RIGHT BUTTOCK BSCESS;  Surgeon: Atilano Ina, MD;  Location: [facility] OR;  Service: General;  Laterality: N/A;  . Placement of seton N/A 11/02/2012    Procedure: PLACEMENT OF SETON;  Surgeon: Atilano Ina, MD;  Location: [facility] OR;  Service: General;  Laterality: N/A;  . Examination under anesthesia N/A 11/02/2012    Procedure: Francia Greaves UNDER ANESTHESIA;  Surgeon: Atilano Ina, MD;  Location: [facility] OR;  Service: General;  Laterality: N/A;    History reviewed. No pertinent family history. Social History:  reports that he has never smoked. He does not have any smokeless tobacco history on file. He reports that he drinks about 14.4 ounces of alcohol per week. He reports that he does not use illicit drugs.  Allergies: No Known Allergies   (Not in a hospital admission)  Results for orders placed during the hospital encounter of 11/03/12 (from the past 48 hour(s))  CBC WITH DIFFERENTIAL     Status: Abnormal   Collection Time    11/03/12  7:00 PM      Result Value Range   WBC 10.2  4.0 - 10.5 K/uL   RBC 4.99  4.22 - 5.81 MIL/uL   Hemoglobin 12.9 (*) 13.0 - 17.0 g/dL   HCT 16.1 (*) 09.6 - 04.5 %   MCV 75.4 (*) 78.0 - 100.0 fL   MCH 25.9 (*) 26.0 - 34.0 pg   MCHC 34.3  30.0 - 36.0 g/dL   RDW 40.9  81.1 - 91.4 %   Platelets 214  150 - 400 K/uL   Neutrophils Relative % 72  43 - 77 %   Neutro  Abs 7.3  1.7 - 7.7 K/uL   Lymphocytes Relative 18  12 - 46 %   Lymphs Abs 1.8  0.7 - 4.0 K/uL   Monocytes Relative 9  3 - 12 %   Monocytes Absolute 0.9  0.1 - 1.0 K/uL   Eosinophils Relative 1  0 - 5 %   Eosinophils Absolute 0.1  0.0 - 0.7 K/uL   Basophils Relative 0  0 - 1 %   Basophils Absolute 0.0  0.0 - 0.1 K/uL  BASIC METABOLIC PANEL     Status: Abnormal   Collection Time    11/03/12  7:00 PM      Result Value Range   Sodium 138  135 - 145 mEq/L   Potassium 3.3 (*) 3.5 - 5.1 mEq/L   Chloride 101  96 - 112 mEq/L   CO2 28  19 - 32 mEq/L   Glucose, Bld 114 (*) 70 - 99 mg/dL   BUN 5 (*) 6 - 23 mg/dL   Creatinine, Ser 7.82  0.50 - 1.35 mg/dL   Calcium 9.2  8.4 - 95.6 mg/dL   GFR calc non Af Amer >90  >90 mL/min   GFR calc Af Amer >90  >  90 mL/min   Comment: (NOTE)     The eGFR has been calculated using the CKD EPI equation.     This calculation has not been validated in all clinical situations.     eGFR's persistently <90 mL/min signify possible Chronic Kidney     Disease.   Ct Pelvis W Contrast  11/01/2012   CLINICAL DATA:  Recurring perianal abscess. History of the surgical drainage.  EXAM: CT PELVIS WITH CONTRAST  TECHNIQUE: Multidetector CT imaging of the pelvis was performed using the standard protocol following the bolus administration of intravenous contrast.  CONTRAST:  OMNIPAQUE IOHEXOL 300 MG/ML  SOLN  COMPARISON:  None.  FINDINGS: There is a perianal fistula on the left at 5 o'clock with soft tissue stranding in the surrounding subcutaneous fat. There is a small associated fluid collection measuring 2.3 cm maximally. Air extends from the anus to perineal skin surface. No significant inflammatory changes are present on the right.  The distal rectum and sigmoid colon appear normal. There are no intrapelvic inflammatory changes or fluid collections. The appendix appears normal. There is no pelvic or inguinal lymphadenopathy.  The urinary bladder, prostate gland and seminal  vesicles appear normal. There are no significant vascular findings.  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 No intrapelvic inflammatory change is demonstrated.   Electronically Signed   By: Bobby Houston   On: 11/01/2012 23:59    Review of Systems  Constitutional: Positive for chills. Negative for fever.  Gastrointestinal: Positive for nausea, vomiting and abdominal pain. Negative for diarrhea.    Blood pressure 112/43, pulse 67, temperature 99.1 F (37.3 C), temperature source Oral, resp. rate 16, height 5\' 8"  (1.727 m), weight 205 lb (92.987 kg), SpO2 97.00%. Physical Exam  Constitutional: He appears well-developed and well-nourished. No distress.  HENT:  Head: Normocephalic and atraumatic.  Cardiovascular: Normal rate and regular rhythm.   Respiratory: Effort normal and breath sounds normal.  GI: Soft. Bowel sounds are normal. He exhibits no mass. There is no tenderness.  Genitourinary:  Left-sided open anorectal wound with seton present. No significant erythema or purulent drainage.  Neurological: He is alert.     Assessment/Plan Status post incision and drainage of recurrent anorectal abscess. Fistula and pain he was found and seton was placed. Inadequate pain control on oral pain medications.  Plan: Admit to the hospital. We'll start IV antibiotics since he's having some chills. Intravenous pain medication and wound care by way of sitz baths. Home once his pain is controlled with oral medication.  Bobby Houston 11/03/2012, 9:59 PM

## 2012-11-03 NOTE — Discharge Summary (Signed)
[name], MD, FACS General, Bariatric, & Minimally Invasive Surgery [facility]  

## 2012-11-03 NOTE — ED Provider Notes (Signed)
CSN: [id number]     Arrival date & time 11/03/12  1708 History   First MD Initiated Contact with Patient 11/03/12 1814     Chief Complaint  Patient presents with  . Drainage from Incision   (Consider location/radiation/quality/duration/timing/severity/associated sxs/prior Treatment) HPI Comments: Patient is a 22 year old male 2 days status post surgery for repair of a perianal abscess performed at [facility]. He tells me that he was to stay for several days however decided he wanted to leave this morning. He was discharged. He states since that time he has had an inability to tolerate oral intake and cannot keep his pain medication down. He says his pain is inadequately controlled. When he was discharged, the patient's acquaintance tells me he was told to return if he develops these symptoms. He denies fevers or chills.  The history is provided by the patient.    History reviewed. No pertinent past medical history. Past Surgical History  Procedure Laterality Date  . Irrigation and debridement abscess N/A 11/02/2012    Procedure: IRRIGATION AND DEBRIDEMENT RIGHT BUTTOCK BSCESS;  Surgeon: Atilano Ina, MD;  Location: [facility] OR;  Service: General;  Laterality: N/A;  . Placement of seton N/A 11/02/2012    Procedure: PLACEMENT OF SETON;  Surgeon: Atilano Ina, MD;  Location: [facility] OR;  Service: General;  Laterality: N/A;  . Examination under anesthesia N/A 11/02/2012    Procedure: Francia Greaves UNDER ANESTHESIA;  Surgeon: Atilano Ina, MD;  Location: [facility] OR;  Service: General;  Laterality: N/A;   No family history on file. History  Substance Use Topics  . Smoking status: Never Smoker   . Smokeless tobacco: Not on file  . Alcohol Use: 14.4 oz/week    24 Cans of beer per week    Review of Systems  All other systems reviewed and are negative.    Allergies  Review of patient's allergies indicates no known allergies.  Home Medications   Current Outpatient Rx  Name  Route  Sig  Dispense  Refill  .  amoxicillin-clavulanate (AUGMENTIN) 875-125 MG per tablet   Oral   Take 1 tablet by mouth 2 (two) times daily.   14 tablet   0   . oxyCODONE 10 MG TABS   Oral   Take 1-1.5 tablets (10-15 mg total) by mouth every 4 (four) hours as needed for pain.   50 tablet   0   . Chlorhexidine Gluconate Cloth 2 % PADS   Topical   Apply 6 each topically daily.   14 each   0   . docusate sodium 100 MG CAPS   Oral   Take 100 mg by mouth 2 (two) times daily.   60 capsule   1   . polyethylene glycol powder (MIRALAX) powder   Oral   Take 8.5-34 g by mouth daily. To correct constipation.  Adjust dose over 1-2 months.  Goal = ~1 bowel movement / day   255 g   0    BP 129/78  Pulse 87  Temp(Src) 99.1 F (37.3 C) (Oral)  Ht 5\' 8"  (1.727 m)  Wt 205 lb (92.987 kg)  BMI 31.18 kg/m2  SpO2 96% Physical Exam  Nursing note and vitals reviewed. Constitutional: He is oriented to person, place, and time. He appears well-developed and well-nourished. No distress.  HENT:  Head: Normocephalic and atraumatic.  Mouth/Throat: Oropharynx is clear and moist.  Neck: Normal range of motion. Neck supple.  Cardiovascular: Normal rate, regular rhythm and normal heart sounds.  No murmur heard. Pulmonary/Chest: Effort normal and breath sounds normal. No respiratory distress. He has no wheezes.  Abdominal: Soft. Bowel sounds are normal. He exhibits no distension. There is no tenderness.  Genitourinary:  The incision site on the right buttock is noted to be open with some serosanguineous drainage. There is no PERRLA discharge and there is no significant surrounding erythema.  Musculoskeletal: Normal range of motion.  Neurological: He is alert and oriented to person, place, and time.  Skin: Skin is warm and dry. He is not diaphoretic.    ED Course  Procedures (including critical care time) Labs Review Labs Reviewed  CBC WITH DIFFERENTIAL  BASIC METABOLIC PANEL   Imaging Review Ct Pelvis W  Contrast  11/01/2012   CLINICAL DATA:  Recurring perianal abscess. History of the surgical drainage.  EXAM: CT PELVIS WITH CONTRAST  TECHNIQUE: Multidetector CT imaging of the pelvis was performed using the standard protocol following the bolus administration of intravenous contrast.  CONTRAST:  OMNIPAQUE IOHEXOL 300 MG/ML  SOLN  COMPARISON:  None.  FINDINGS: There is a perianal fistula on the left at 5 o'clock with soft tissue stranding in the surrounding subcutaneous fat. There is a small associated fluid collection measuring 2.3 cm maximally. Air extends from the anus to perineal skin surface. No significant inflammatory changes are present on the right.  The distal rectum and sigmoid colon appear normal. There are no intrapelvic inflammatory changes or fluid collections. The appendix appears normal. There is no pelvic or inguinal lymphadenopathy.  The urinary bladder, prostate gland and seminal vesicles appear normal. There are no significant vascular findings.  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 No intrapelvic inflammatory change is demonstrated.   Electronically Signed   By: Roxy Horseman   On: 11/01/2012 23:59    MDM  No diagnosis found. Patient presents here with persistent pain and vomiting. He is recent postop from anal fistula surgery. He was given fluids and pain medication states he is in too much discomfort to go home. I discussed the case with Dr. Purnell Shoemaker who agrees to admit the patient.      Geoffery Lyons, MD 11/03/12 2154

## 2012-11-03 NOTE — Progress Notes (Signed)
Discharge instructions given. Pt verbalized understanding and all questions were answered.  

## 2012-11-03 NOTE — Discharge Summary (Signed)
Physician Discharge Summary  Jakeob Tullis HKV:[id number] DOB: November 19, 1990 DOA: 11/01/2012  PCP: No PCP Per Patient  Consultation: none  Admit date: 11/01/2012 Discharge date: 11/03/2012  Recommendations for Outpatient Follow-up:   Follow-up Information   Follow up with Atilano Ina, MD. Schedule an appointment as soon as possible for a visit in 2 weeks.   Specialty:  General Surgery   Contact information:   [address] [place] [address] [phone]      Discharge Diagnoses:  1. Perianal fistual 2. Left buttock abscess    Surgical Procedure: IRRIGATION AND DEBRIDEMENT RIGHT BUTTOCK ABSCESS (Dr. Andrey Campanile 11/02/12)   Discharge Condition: stable Disposition: home  Diet recommendation: high fiber  Filed Weights   11/02/12 0220  Weight: 197 lb 1.5 oz (89.4 kg)    Filed Vitals:   11/03/12 0624  BP: 106/64  Pulse: 73  Temp: 98.9 F (37.2 C)  Resp: 18   Hospital Course:  Bobby Houston is a healthy 22 year old male who presented to [facility] with severe pain with brown drainage per left buttock.  He was found to have a left buttock abscess and suspected fistula in ano.  He was admitted for IV atbx and subsequently underwent I&D.  The fistula was confirmed and a vessel loop was inserted.  He was transferred to the floor.  His diet was advanced, ambulated and pain medication were given.  His vital signs remained stable.  Labs showed a mildly elevated white count, he was treated with atbx for.   On POD #1 he reported less pain.  Continued drainage, but much. I examined his wound, vessel loop was intact.  He was ambulating, tolerating a diet.  We discussed wound care. We discussed good bowel habits and taking miralax every day.  He was given a Rx for pain meds, however, wants to return to work Monday.  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  He verbalized understanding.  He was asked to take sits baths  daily.  He will follow up with Dr. Andrey Campanile in 2 weeks.  Dr. Andrey Campanile will set up appt with Dr. Romie Levee in the future to revise the fistula.  He understands the plan of care.     General appearance: alert and oriented. Calm and cooperative No acute distress. VSS. Afebrile.  GI: soft round and nontender. +BS x4 quadrants. No organomegaly, hernias or masses.  Skin: left buttocks wound with purulent drainage, vessel loop in place, dressing was changed.     Discharge Instructions     Medication List         amoxicillin-clavulanate 875-125 MG per tablet  Commonly known as:  AUGMENTIN  Take 1 tablet by mouth 2 (two) times daily.     Chlorhexidine Gluconate Cloth 2 % Pads  Apply 6 each topically daily.     DSS 100 MG Caps  Take 100 mg by mouth 2 (two) times daily.     Oxycodone HCl 10 MG Tabs  Take 1-1.5 tablets (10-15 mg total) by mouth every 4 (four) hours as needed for pain.     polyethylene glycol powder powder  Commonly known as:  MIRALAX  Take 8.5-34 g by mouth daily. To correct constipation.  Adjust dose over 1-2 months.  Goal = ~1 bowel movement / day           Follow-up Information   Follow up with Atilano Ina, MD. Schedule an appointment as soon as possible for  a visit in 2 weeks.   Specialty:  General Surgery   Contact information:   [address] [place] [address] [phone]        The results of significant diagnostics from this hospitalization (including imaging, microbiology, ancillary and laboratory) are listed below for reference.    Significant Diagnostic Studies: Ct Pelvis W Contrast  11/01/2012   CLINICAL DATA:  Recurring perianal abscess. History of the surgical drainage.  EXAM: CT PELVIS WITH CONTRAST  TECHNIQUE: Multidetector CT imaging of the pelvis was performed using the standard protocol following the bolus administration of intravenous contrast.  CONTRAST:  OMNIPAQUE IOHEXOL 300 MG/ML  SOLN  COMPARISON:  None.   FINDINGS: There is a perianal fistula on the left at 5 o'clock with soft tissue stranding in the surrounding subcutaneous fat. There is a small associated fluid collection measuring 2.3 cm maximally. Air extends from the anus to perineal skin surface. No significant inflammatory changes are present on the right.  The distal rectum and sigmoid colon appear normal. There are no intrapelvic inflammatory changes or fluid collections. The appendix appears normal. There is no pelvic or inguinal lymphadenopathy.  The urinary bladder, prostate gland and seminal vesicles appear normal. There are no significant vascular findings.  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 No intrapelvic inflammatory change is demonstrated.   Electronically Signed   By: Roxy Horseman   On: 11/01/2012 23:59    Microbiology: Recent Results (from the past 240 hour(s))  SURGICAL PCR SCREEN     Status: Abnormal   Collection Time    11/02/12  5:05 AM      Result Value Range Status   MRSA, PCR NEGATIVE  NEGATIVE Final   Staphylococcus aureus POSITIVE (*) NEGATIVE Final   Comment:            The Xpert SA Assay (FDA     approved for NASAL specimens     in patients over 30 years of age),     is one component of     a comprehensive surveillance     program.  Test performance has     been validated by [employer] for patients greater     than or equal to 71 year old.     It is not intended     to diagnose infection nor to     guide or monitor treatment.     Labs: Basic Metabolic Panel:  Recent Labs Lab 11/01/12 2135  NA 137  K 3.5  CL 100  CO2 26  GLUCOSE 87  BUN 10  CREATININE 0.96  CALCIUM 8.8   CBC:  Recent Labs Lab 11/01/12 2135  WBC 11.8*  NEUTROABS 9.6*  HGB 13.5  HCT 38.2*  MCV 74.8*  PLT 224    Active Problems:   Perianal fistula   Signed:  Brayley Mackowiak, ANP-BC

## 2012-11-03 NOTE — Anesthesia Postprocedure Evaluation (Signed)
  Anesthesia Post-op Note  Patient: Alrick Cubbage  Procedure(s) Performed: Procedure(s): IRRIGATION AND DEBRIDEMENT RIGHT BUTTOCK BSCESS (N/A) PLACEMENT OF SETON (N/A) EXAM UNDER ANESTHESIA (N/A)  Patient Location: Nursing Unit  Anesthesia Type:General  Level of Consciousness: awake, alert , oriented and patient cooperative  Airway and Oxygen Therapy: Patient Spontanous Breathing  Post-op Pain: mild  Post-op Assessment: Post-op Vital signs reviewed, Patient's Cardiovascular Status Stable, Respiratory Function Stable, Patent Airway, No signs of Nausea or vomiting, Adequate PO intake, Pain level controlled, No headache, No backache, No residual numbness and No residual motor weakness  Post-op Vital Signs: Reviewed and stable  Complications: No apparent anesthesia complications

## 2012-11-03 NOTE — ED Notes (Signed)
Pt complains of yellow and red drainage coming from a x1 day post-surgery rectal abcess.  Pt c/o of facial swelling and pain 10 out of 10.  Pt complains pain medications prescribed are ineffective.

## 2012-11-03 NOTE — ED Provider Notes (Signed)
Medical screening examination/treatment/procedure(s) were performed by non-physician practitioner and as supervising physician I was immediately available for consultation/collaboration.  Kailei Cowens R. Ticara Waner, MD 11/03/12 1612 

## 2012-11-04 ENCOUNTER — Encounter (HOSPITAL_COMMUNITY): Payer: Self-pay | Admitting: General Surgery

## 2012-11-04 DIAGNOSIS — R112 Nausea with vomiting, unspecified: Secondary | ICD-10-CM

## 2012-11-04 MED ORDER — OXYCODONE HCL 5 MG PO TABS: 10.0000 mg | ORAL_TABLET | ORAL | Status: DC | PRN

## 2012-11-04 NOTE — Progress Notes (Signed)
  Subjective: Still feels bad, pain issues, concerned there is no one at home to help him.   Objective: Vital signs in last 24 hours: Temp:  [98.3 F (36.8 C)-99.1 F (37.3 C)] 98.3 F (36.8 C) (09/26 0546) Pulse Rate:  [58-87] 65 (09/26 0546) Resp:  [16] 16 (09/26 0546) BP: (106-129)/(43-78) 106/56 mmHg (09/26 0546) SpO2:  [96 %-99 %] 99 % (09/26 0546) Weight:  [92.987 kg (205 lb)] 92.987 kg (205 lb) (09/25 1753) Last BM Date: 11/02/12 Diet:  Regular Afebrile, VSS K+ 3.3  Intake/Output from previous day: 09/25 0701 - 09/26 0700 In: 220 [I.V.:220] Out: 275 [Urine:275] Intake/Output this shift:    General appearance: alert, no distress and anxious Skin: Skin color, texture, turgor normal. No rashes or lesions or site is clean.  very painful.  Lab Results:   Recent Labs  11/01/12 2135 11/03/12 1900  WBC 11.8* 10.2  HGB 13.5 12.9*  HCT 38.2* 37.6*  PLT 224 214    BMET  Recent Labs  11/01/12 2135 11/03/12 1900  NA 137 138  K 3.5 3.3*  CL 100 101  CO2 26 28  GLUCOSE 87 114*  BUN 10 5*  CREATININE 0.96 0.96  CALCIUM 8.8 9.2   PT/INR No results found for this basename: LABPROT, INR,  in the last 72 hours  No results found for this basename: AST, ALT, ALKPHOS, BILITOT, PROT, ALBUMIN,  in the last 168 hours   Lipase  No results found for this basename: lipase     Studies/Results: No results found.  Medications: . docusate sodium  100 mg Oral BID  . enoxaparin (LOVENOX) injection  40 mg Subcutaneous Q24H  . magnesium hydroxide  30 mL Oral Daily  . piperacillin-tazobactam (ZOSYN)  IV  3.375 g Intravenous Q8H    Assessment/Plan A/p I/D of anorectal abscess with fistula, seton placement 11/02/12 Home 9/25 Readmitted in PM with pain, nausea and vomiting Restart po pain meds, ambulate,sitz bath, and shower.   He ate breakfast and has showered.  He wants to go home.  He also wants pain medicine like he had in the hospital.  I told him we would not  send him home on dilaudid.  He needed to stay here till he could control his pain with the oxycodone he was already on.  He wants to go now so I will discharge him.    LOS: 1 day    Sahily Biddle 11/04/2012

## 2012-11-04 NOTE — Care Management Note (Signed)
    Page 1 of 1   11/04/2012     12:46:27 PM   CARE MANAGEMENT NOTE 11/04/2012  Patient:  Bobby Houston, Bobby Houston   Account Number:  [id number]  Date Initiated:  11/04/2012  Documentation initiated by:  Colleen Can  Subjective/Objective Assessment:   dx post op pain; s/p incision and drainage peri-rectaql abscess 11/02/12     Action/Plan:   Home upon discharge. No HH or DME needs.   Anticipated DC Date:  11/04/2012   Anticipated DC Plan:  HOME/SELF CARE      DC Planning Services  CM consult      Choice offered to / List presented to:             Status of service:  Completed, signed off Medicare Important Message given?   (If response is "NO", the following Medicare IM given date fields will be blank) Date Medicare IM given:   Date Additional Medicare IM given:    Discharge Disposition:  HOME/SELF CARE  Per UR Regulation:  Reviewed for med. necessity/level of care/duration of stay  If discussed at Long Length of Stay Meetings, dates discussed:    Comments:

## 2012-11-04 NOTE — Discharge Summary (Signed)
Physician Discharge Summary  Patient ID: Bobby Houston MRN: [id number] DOB/AGE: 01-Aug-1990 22 y.o.  Admit date: 11/03/2012 Discharge date: 11/04/2012  Admission Diagnoses:  S/p I/D of anorectal abscess with fistula, seton placement 11/02/12  Home 9/25  Readmitted in PM with pain, nausea and vomiting  Restart po pain meds, ambulate,sitz bath, and shower.   Discharge Diagnoses: Same Active Problems:   * No active hospital problems. 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 He has been unable to control his pain at home and has had chills. He's also had nausea and vomiting. He presented back to the [facility] for further evaluation and treatment. He was placed on Zosyn, and IV fluids.  We gave him IV Dilaudid for pain.  He was better this AM.  He was able to eat breakfast and shower.  He then ask to be discharged.  He planned to go to [facility].  I saw him again and he said he wanted pain medicine like he had here in the hospital.  I told him we would not send him home on dilaudid.  We would keep him till he could tolerate discomfort with the oxycodone he was currently prescribed.  He ask to be discharged, and we are letting him go.  Resuming prior discharge medicines as before.  Follow up in 2 weeks with Dr. Andrey Campanile.  Condition on d/c:  Improved Disposition: 01-Home or Self Care     Medication List         amoxicillin-clavulanate 875-125 MG per tablet  Commonly known as:  AUGMENTIN  Take 1 tablet by mouth 2 (two) times daily.     Chlorhexidine Gluconate Cloth 2 % Pads  Apply 6 each topically daily.     DSS 100 MG Caps  Take 100 mg by mouth 2 (two) times daily.     Oxycodone HCl 10 MG Tabs  Take 1-1.5 tablets (10-15 mg total) by mouth every 4 (four) hours as needed for pain.      polyethylene glycol powder powder  Commonly known as:  MIRALAX  Take 8.5-34 g by mouth daily. To correct constipation.  Adjust dose over 1-2 months.  Goal = ~1 bowel movement / day           Follow-up Information   Follow up with Atilano Ina, MD In 2 weeks.   Specialty:  General Surgery   Contact information:   [address] [place] [address] [phone]       Signed: Sherrie George 11/04/2012, 10:59 AM

## 2012-11-23 ENCOUNTER — Telehealth (INDEPENDENT_AMBULATORY_CARE_PROVIDER_SITE_OTHER): Payer: Self-pay | Admitting: *Deleted

## 2012-11-23 NOTE — Telephone Encounter (Signed)
Patient called to report that a "rubber band" fell out today.  Explained to patient that this is normal and expected.  Patient also stated that he is having some drainage.  Patient denies vomiting, fevers, or difficulty with BMs.  Encouraged patient to come for his appt tomorrow to see Dr. Andrey Campanile.  Patient states understanding and agreeable at this time.

## 2012-11-24 ENCOUNTER — Encounter (INDEPENDENT_AMBULATORY_CARE_PROVIDER_SITE_OTHER): Payer: Self-pay | Admitting: General Surgery

## 2012-11-24 ENCOUNTER — Ambulatory Visit (INDEPENDENT_AMBULATORY_CARE_PROVIDER_SITE_OTHER): Payer: Managed Care, Other (non HMO) | Admitting: General Surgery

## 2012-11-24 VITALS — BP 118/68 | HR 68 | Temp 98.2°F | Resp 14 | Ht 68.0 in | Wt 195.2 lb

## 2012-11-24 DIAGNOSIS — K603 Anal fistula: Secondary | ICD-10-CM

## 2012-11-24 NOTE — Patient Instructions (Signed)
Anal Fistula An anal fistula is an abnormal tunnel that develops between the bowel and skin near the outside of the anus, where feces comes out. The anus has a number of tiny glands that make lubricating fluid. Sometimes these glands can become plugged and infected. This may lead to the development of a fluid-filled pocket (abscess). An anal fistula often develops after this infection or abscess. It is nearly always caused by a past or current anal abscess.  CAUSES  Though an anal fistula is almost always caused by a past or current anal abscess, other causes can include:  A complication of surgery.  Trauma to the rectal area.  Radiation to the area.  Other medical conditions or diseases, such as:   Chronic inflammatory bowel disease, such as Crohn disease or ulcerative colitis.   Colon or rectal cancer.   Diverticular disease, such as diverticulitis.   A sexually transmitted disease, such as gonorrhea, chlamydia, or syphilis.  An HIV infection or AIDS.  SYMPTOMS   Throbbing or constant pain that may be worse when sitting.   Swelling or irritation around the anus.   Drainage of pus or blood from an opening near the anus.   Pain with bowel movements.  Fever or chills. DIAGNOSIS  Your caregiver will examine the area to find the openings of the anal fistula and the fistula tract. The external opening of the anal fistula may be seen during a physical examination. Other examinations that may be performed include:   Examination of the rectal area with a gloved hand (digital rectal exam).   Examination with a probe or scope to help locate the internal opening of the fistula.   Injection of a dye into the fistula opening. X-rays can be taken to find the exact location and path of the fistula.   An MRI or ultrasound of the anal area.  Other tests may be performed to find the cause of the anal fistula.   TREATMENT  The most common treatment for an anal fistula is  surgery. There are different surgery options depending on where your fistula is located and how complex the fistula is. Surgical options include:  A fistulotomy. This surgery involves opening up the whole fistula and draining the contents inside to promote healing.  Seton placement. A silk string (seton) is placed into the fistula during a fistulotomy to drain any infection to promote healing.  Advancement flap procedure. Tissue is removed from your rectum or the skin around the anus and is attached to the opening of the fistula.  Bioprosthetic plug. A cone-shaped plug is made from your tissue and is used to block the opening of the fistula. Some anal fistulas do not require surgery. A fibrin glue is a non-surgical option that involves injecting the glue to seal the fistula. You also may be prescribed an antibiotic medicine to treat an infection.  HOME CARE INSTRUCTIONS   Take your antibiotics as directed. Finish them even if you start to feel better.  Only take over-the-counter or prescription medicines as directed by your caregiver.Use a stool softener or laxative, if recommended.   Eat a high-fiber diet to help avoid constipation or as directed by your caregiver.  Drink enough water to keep your urine clear or pale yellow.   A warm sitz bath may be soothing and help with healing. You may take warm sitz baths for 15 20 minutes, 3 4 times a day to ease pain and discomfort.   Follow excellent hygiene to keep the  anal area as clean and dry as possible. Use wet toilet paper or moist towelettes after each bowel movement.  SEEK MEDICAL CARE IF: You have increased pain not controlled with medicines.  SEEK IMMEDIATE MEDICAL CARE IF:  You have severe, intolerable pain.  You have new swelling, redness, or discharge around the anal area.  You have tenderness or warmth around the anal area.  You have chills or diarrhea.  You have severe problems urinating or having a bowel movement.    You have a fever or persistent symptoms for more than 2 3 days.   You have a fever and your symptoms suddenly get worse.  MAKE SURE YOU:   Understand these instructions.  Will watch your condition.  Will get help right away if you are not doing well or get worse. Document Released: 01/09/2008 Document Revised: 01/13/2012 Document Reviewed: 12/01/2010 [facility] Patient Information 2014 [place], [place].  Anal Abscess/Fistula  What is an anal abscess? An anal abscess is an infected cavity filled with pus found near the anus or rectum. What is an anal fistula? An anal fistula (also called fistula-in-ano) is frequently the result of a previous or current anal abscess, occurring in up to 50% of patients with abscesses. Normal anatomy includes small glands just inside the anus. Occasionally, these glands get clogged and potentially can become infected, leading to an abscess. The fistula is a tunnel that forms under the skin and connects the infected glands to the abscess. A fistula can be present with or without an abscess and may connect just to the skin of the buttocks near the anal opening. Other situations that can result in a fistula include Crohn's disease, radiation, trauma and malignancy. How does someone get an anal abscess or a fistula? The abscess is most often a result of an acute infection in the internal glands of the anus. Occasionally, bacteria, fecal material or foreign matter can clog the anal gland and create a condition for an abscess cavity to form. Other medical conditions can make these types of infections more likely.  After an abscess drains on its own or has been drained (opened), a tunnel (fistula) may persist, connecting the infected anal gland to the external skin. This typically will involve some type of drainage from the external opening and occurs in up to 50% of abscesses. If the opening on the skin heals when a fistula is present, a recurrent abscess may  develop. What are the specific signs or symptoms of an abscess or fistula? A patient with an abscess may have pain, redness or swelling in the area around the anal area. Fatigue, general malaise, as well as accompanying fever or chills are also common. Similar signs and symptoms may be present when patients have a fistula, with the addition of possible irritation of the perianal skin or drainage from an external opening. Is any specific testing necessary to diagnose an abscess or fistula? No. Most anal abscesses or fistula-in-ano are diagnosed and managed on the basis of clinical findings. Occasionally, additional studies such as ultrasound, CT scan, or MRI can assist with the diagnosis of deeper abscesses or the delineation of the fistula tunnel to help guide treatment.  What is the treatment of an anal abscess? The treatment of an abscess is surgical drainage under most circumstances. An incision is made in the skin near the anus to drain the infection. This can be done in a doctor's office with local anesthetic or in an operating room under deeper anesthesia. Hospitalization may be  required for patients prone to more significant infections such as diabetics or patients with decreased immunity. Are antibiotics required to treat this type of infection? Antibiotics alone are a poor alternative to drainage of the infection. For uncomplicated abscesses, the addition of antibiotics to surgical drainage does not improve healing time or reduce the potential for recurrences. There are some conditions in which antibiotics are indicated, such as for patients with compromised or altered immunity, some cardiac valvular conditions or extensive cellulitis. A comprehensive discussion of your past medical history and a physical exam are important to determine if antibiotics are indicated. What is the treatment of an anal fistula? Surgery is almost always necessary to cure an anal fistula. Although surgery can be fairly  straightforward, it may also be complicated, occasionally requiring staged or multiple operations. Consider identifying a specialist in colon and rectal surgery who would be familiar with a number of potential operations to treat the fistula. The surgery may be performed at the same time as drainage of an abscess, although sometimes the fistula doesn't appear until weeks to years after the initial drainage. If the fistula is straightforward, a fistulotomy may be performed. This procedure involves connecting the internal opening within the anal canal to the external opening, creating a groove that will heal from the inside out. This surgery often will require dividing a small portion of the sphincter muscle which has the unlikely potential for affecting the control of bowel movements in a limited number of cases.  Other procedures include placing material within the fistula tract to occlude it or surgically altering the surrounding tissue to accomplish closure of the fistula, with the choice of procedure depending upon the type, length, and location of the fistula. Most of the operations can be performed on an outpatient basis, but may occasionally require hospitalization. What is the recovery like from surgery? Pain after surgery is controlled with pain pills, fiber and bulk laxatives. Patients should plan for time at home using sitz baths and attempt to avoid the constipation that can be associated with prescription pain medication. Discuss with your surgeon the specific care and time away from work prior to surgery to prepare yourself for post-operative care. Can the abscess or fistula recur? If adequately treated and properly healed, both are unlikely to return. However, despite proper and indicated open or minimally invasive treatment, both abscesses and fistulas can potentially recur. Should similar symptoms arise, suggesting recurrence, it is recommended that you find a colon and rectal surgeon to manage  your condition. What is a colon and rectal surgeon? Colon and rectal surgeons are experts in the surgical and non-surgical treatment of diseases of the colon, rectum and anus. They have completed advanced surgical training in the treatment of these diseases as well as full general surgical training. Board-certified colon and rectal surgeons complete residencies in general surgery and colon and rectal surgery, and pass intensive examinations conducted by the American Board of Surgery and the American Board of Colon and Rectal Surgery. They are well-versed in the treatment of both benign and malignant diseases of the colon, rectum and anus and are able to perform routine screening examinations and surgically treat conditions if indicated to do so.  author: Mikle Bosworth, MD, FACS, FASCRS, on behalf of the ASCRS Public Relations Committee  [id number] American Society of Colon & Rectal Surgeons  GETTING TO GOOD BOWEL HEALTH. Irregular bowel habits such as constipation and diarrhea can lead to many problems over time.  Having one soft bowel movement a day  is the most important way to prevent further problems.  The anorectal canal is designed to handle stretching and feces to safely manage our ability to get rid of solid waste (feces, poop, stool) out of our body.  BUT, hard constipated stools can act like ripping concrete bricks and diarrhea can be a burning fire to this very sensitive area of our body, causing inflamed hemorrhoids, anal fissures, increasing risk is perirectal abscesses, abdominal pain/bloating, an making irritable bowel worse.     The goal: ONE SOFT BOWEL MOVEMENT A DAY!  To have soft, regular bowel movements:    Drink at least 8 tall glasses of water a day.     Take plenty of fiber.  Fiber is the undigested part of plant food that passes into the colon, acting s "natures broom" to encourage bowel motility and movement.  Fiber can absorb and hold large amounts of water. This results in a  larger, bulkier stool, which is soft and easier to pass. Work gradually over several weeks up to 6 servings a day of fiber (25g a day even more if needed) in the form of: o Vegetables -- Root (potatoes, carrots, turnips), leafy green (lettuce, salad greens, celery, spinach), or cooked high residue (cabbage, broccoli, etc) o Fruit -- Fresh (unpeeled skin & pulp), Dried (prunes, apricots, cherries, etc ),  or stewed ( applesauce)  o Whole grain breads, pasta, etc (whole wheat)  o Bran cereals    Bulking Agents -- This type of water-retaining fiber generally is easily obtained each day by one of the following:  o Psyllium bran -- The psyllium plant is remarkable because its ground seeds can retain so much water. This product is available as Metamucil, Konsyl, Effersyllium, Per Diem Fiber, or the less expensive generic preparation in drug and health food stores. Although labeled a laxative, it really is not a laxative.  o Methylcellulose -- This is another fiber derived from wood which also retains water. It is available as Citrucel. o Benefiber o Polyethylene Glycol - and "artificial" fiber commonly called Miralax or Glycolax.  It is helpful for people with gassy or bloated feelings with regular fiber o Flax Seed - a less gassy fiber than psyllium   No reading or other relaxing activity while on the toilet. If bowel movements take longer than 5 minutes, you are too constipated   AVOID CONSTIPATION.  High fiber and water intake usually takes care of this.  Sometimes a laxative is needed to stimulate more frequent bowel movements, but    Laxatives are not a good long-term solution as it can wear the colon out. o Osmotics (Milk of Magnesia, Fleets phosphosoda, Magnesium citrate, MiraLax, GoLytely) are safer than  o Stimulants (Senokot, Castor Oil, Dulcolax, Ex Lax)    o Do not take laxatives for more than 7days in a row.    IF SEVERELY CONSTIPATED, try a Bowel Retraining Program: o Do not use laxatives.   o Eat a diet high in roughage, such as bran cereals and leafy vegetables.  o Drink six (6) ounces of prune or apricot juice each morning.  o Eat two (2) large servings of stewed fruit each day.  o Take one (1) heaping tablespoon of a psyllium-based bulking agent twice a day. Use sugar-free sweetener when possible to avoid excessive calories.  o Eat a normal breakfast.  o Set aside 15 minutes after breakfast to sit on the toilet, but do not strain to have a bowel movement.  o If you do  not have a bowel movement by the third day, use an enema and repeat the above steps.   

## 2012-11-28 NOTE — Progress Notes (Signed)
Subjective:     Patient ID: Bobby Houston, male   DOB: 1990-08-30, 22 y.o.   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 He was discharged home September 24. However he presented to [facility] long that evening complaining of pain. He has only kept overnight for observation. The patient was discharged per his request the following day. The patient states that he's been doing okay. He states that he continues to have drainage from around the area. He denies any fevers or chills. Denies any dysuria. She reports 2 bowel movements per day. He is taking a stool softener. He says that area is very sore. He states that the rubber band fell out while he was up in [place]. he denies any bleeding. He reports yellowish drainage.  Review of Systems     Objective:   Physical Exam BP 118/68  Pulse 68  Temp(Src) 98.2 F (36.8 C) (Temporal)  Resp 14  Ht 5\' 8"  (1.727 m)  Wt 195 lb 3.2 oz (88.542 kg)  BMI 29.69 kg/m2 Alert, nad cta Reg Soft, nt, nd Rectal - visual inspection only. Left anterior position about 1.5 in from anal verge reveals about 2cm open skin wound. Not deep. Very shallow. In center has pinpoint sinus that easily expresses thin yellowish drainage. No cellulitis. No fluctuance. No induration. 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 I spent a lot of time discussing the management of perianal fistulas. I Drew diagrams. The area of debridement on the left buttock appears to be healing quite well. He reports likely he had been in the hospital that he has had over 6 months of intermittent drainage. I explained that I doubt the fistula would heal by itself. I have referred him to our colorectal  surgeon Dr. Maisie Fus. I explained different options that she may end up having to do. In the interim he was encouraged to drink plenty water, a high-fiber diet, continue sitz baths, and take Motrin or Tylenol as needed.   Mary Sella. Andrey Campanile, MD, FACS General, Bariatric, & Minimally Invasive Surgery [facility], [place]

## 2012-12-13 ENCOUNTER — Telehealth (INDEPENDENT_AMBULATORY_CARE_PROVIDER_SITE_OTHER): Payer: Self-pay

## 2012-12-13 NOTE — Telephone Encounter (Signed)
Message copied by Ivory Broad on Tue Dec 13, 2012  2:47 PM ------      Message from: Larry Sierras      Created: Tue Dec 13, 2012  2:05 PM      Regarding: APPT       Contact: [phone]       PT NEEDS DIFFERENT APPT TIME WITH AT TOMORROW/12-14-12.  HAS ALREADY LOST TIME AT WORK.  ANY OPTIONS? CALL PT/GM ------

## 2012-12-13 NOTE — Telephone Encounter (Signed)
I called and discussed an alternate day and time but he declined and will keep the appointment tomorrow.  He will call if he has an problems getting here.

## 2012-12-14 ENCOUNTER — Ambulatory Visit (INDEPENDENT_AMBULATORY_CARE_PROVIDER_SITE_OTHER): Payer: Managed Care, Other (non HMO) | Admitting: General Surgery

## 2012-12-14 ENCOUNTER — Encounter (INDEPENDENT_AMBULATORY_CARE_PROVIDER_SITE_OTHER): Payer: Self-pay | Admitting: General Surgery

## 2012-12-14 VITALS — BP 122/70 | HR 72 | Temp 98.3°F | Resp 15 | Ht 68.0 in | Wt 205.6 lb

## 2012-12-14 DIAGNOSIS — K602 Anal fissure, unspecified: Secondary | ICD-10-CM

## 2012-12-14 MED ORDER — LIDOCAINE 5 % EX OINT: 1.0000 "application " | TOPICAL_OINTMENT | CUTANEOUS | Status: DC | PRN

## 2012-12-14 NOTE — Patient Instructions (Signed)
Fiber Chart  You should 25-30g of fiber per day and drinking 8 glasses of water to help your bowels move regularly.  In the chart below you can look up how much fiber you are getting in an average day.  If you are not getting enough fiber, you should add a fiber supplement to your diet.  Examples of this include Metamucil, FiberCon and Citrucel.  These can be purchased at your local grocery store or pharmacy.      [email or web address]   Anal Abscess/Fistula  What is an anal abscess? An anal abscess is an infected cavity filled with pus found near the anus or rectum. What is an anal fistula? An anal fistula (also called fistula-in-ano) is frequently the result of a previous or current anal abscess, occurring in up to 50% of patients with abscesses. Normal anatomy includes small glands just inside the anus. Occasionally, these glands get clogged and potentially can become infected, leading to an abscess. The fistula is a tunnel that forms under the skin and connects the infected glands to the abscess. A fistula can be present with or without an abscess and may connect just to the skin of the buttocks near the anal opening. Other situations that can result in a fistula include Crohn's disease, radiation, trauma and malignancy. How does someone get an anal abscess or a fistula? The abscess is most often a result of an acute infection in the internal glands of the anus. Occasionally, bacteria, fecal material or foreign matter can clog the anal gland and create a condition for an abscess cavity to form. Other medical conditions can make these types of infections more likely.  After an abscess drains on its own or has been drained (opened), a tunnel (fistula) may persist, connecting the infected anal gland to the external skin. This typically will involve some type of drainage from the external opening and occurs in up to 50% of abscesses. If the  opening on the skin heals when a fistula is present, a recurrent abscess may develop. What are the specific signs or symptoms of an abscess or fistula? A patient with an abscess may have pain, redness or swelling in the area around the anal area. Fatigue, general malaise, as well as accompanying fever or chills are also common. Similar signs and symptoms may be present when patients have a fistula, with the addition of possible irritation of the perianal skin or drainage from an external opening. Is any specific testing necessary to diagnose an abscess or fistula? No. Most anal abscesses or fistula-in-ano are diagnosed and managed on the basis of clinical findings. Occasionally, additional studies such as ultrasound, CT scan, or MRI can assist with the diagnosis of deeper abscesses or the delineation of the fistula tunnel to help guide treatment.  What is the treatment of an anal abscess? The treatment of an abscess is surgical drainage under most circumstances. An incision is made in the skin near the anus to drain the infection. This can be done in a doctor's office with local anesthetic or in an operating room under deeper anesthesia. Hospitalization may be required for patients prone to more significant infections such as diabetics or patients with decreased immunity. Are antibiotics required to treat this type of infection? Antibiotics alone are a poor alternative to drainage of the infection. For uncomplicated abscesses, the addition of antibiotics to surgical drainage does not improve healing time or reduce the potential for recurrences. There are some conditions in which antibiotics are indicated, such as for  patients with compromised or altered immunity, some cardiac valvular conditions or extensive cellulitis. A comprehensive discussion of your past medical history and a physical exam are important to determine if antibiotics are indicated. What is the treatment of an anal fistula? Surgery is  almost always necessary to cure an anal fistula. Although surgery can be fairly straightforward, it may also be complicated, occasionally requiring staged or multiple operations. Consider identifying a specialist in colon and rectal surgery who would be familiar with a number of potential operations to treat the fistula. The surgery may be performed at the same time as drainage of an abscess, although sometimes the fistula doesn't appear until weeks to years after the initial drainage. If the fistula is straightforward, a fistulotomy may be performed. This procedure involves connecting the internal opening within the anal canal to the external opening, creating a groove that will heal from the inside out. This surgery often will require dividing a small portion of the sphincter muscle which has the unlikely potential for affecting the control of bowel movements in a limited number of cases.  Other procedures include placing material within the fistula tract to occlude it or surgically altering the surrounding tissue to accomplish closure of the fistula, with the choice of procedure depending upon the type, length, and location of the fistula. Most of the operations can be performed on an outpatient basis, but may occasionally require hospitalization. What is the recovery like from surgery? Pain after surgery is controlled with pain pills, fiber and bulk laxatives. Patients should plan for time at home using sitz baths and attempt to avoid the constipation that can be associated with prescription pain medication. Discuss with your surgeon the specific care and time away from work prior to surgery to prepare yourself for post-operative care. Can the abscess or fistula recur? If adequately treated and properly healed, both are unlikely to return. However, despite proper and indicated open or minimally invasive treatment, both abscesses and fistulas can potentially recur. Should similar symptoms arise, suggesting  recurrence, it is recommended that you find a colon and rectal surgeon to manage your condition. What is a colon and rectal surgeon? Colon and rectal surgeons are experts in the surgical and non-surgical treatment of diseases of the colon, rectum and anus. They have completed advanced surgical training in the treatment of these diseases as well as full general surgical training. Board-certified colon and rectal surgeons complete residencies in general surgery and colon and rectal surgery, and pass intensive examinations conducted by the American Board of Surgery and the American Board of Colon and Rectal Surgery. They are well-versed in the treatment of both benign and malignant diseases of the colon, rectum and anus and are able to perform routine screening examinations and surgically treat conditions if indicated to do so.  author: Mikle Bosworth, MD, FACS, FASCRS, on behalf of the ASCRS Public Relations Committee  1610 [employer] Society of Colon & Rectal Surgeons

## 2012-12-14 NOTE — Progress Notes (Signed)
Chief Complaint  Patient presents with  . 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 male who presents to the office with anorectal pain.  He is s/p I&D and seton placement, but the seton fell out on a trip.  Other symptoms include drainage with BM's.  This had been occurring for several weeks.  He has tried warm showers in the past with some success.  BM's makes the symptoms worse.   It is intermittent in nature.  His bowel habits are regular and his bowel movements are varied.  His fiber intake is minimal.  He reports intermittent drainage especially at night. He denies any FH of crohn's disease or UC.      Past Medical History  Diagnosis Date  . Nausea with vomiting 11/04/2012    Post discharge with severe pain.      Past Surgical History  Procedure Laterality Date  . Irrigation and debridement abscess N/A 11/02/2012    Procedure: IRRIGATION AND DEBRIDEMENT RIGHT BUTTOCK BSCESS;  Surgeon: Eric M Wilson, MD;  Location: [facility] OR;  Service: General;  Laterality: N/A;  . Placement of seton N/A 11/02/2012    Procedure: PLACEMENT OF SETON;  Surgeon: Eric M Wilson, MD;  Location: [facility] OR;  Service: General;  Laterality: N/A;  . Examination under anesthesia N/A 11/02/2012    Procedure: EXAM UNDER ANESTHESIA;  Surgeon: Eric M Wilson, MD;  Location: [facility] OR;  Service: General;  Laterality: N/A;        Current Outpatient Prescriptions  Medication Sig Dispense Refill  . Chlorhexidine Gluconate Cloth 2 % PADS Apply 6 each topically daily.  14 each  0  . docusate sodium 100 MG CAPS Take 100 mg by mouth 2 (two) times daily.  60 capsule  1  . polyethylene glycol powder (MIRALAX) powder Take 8.5-34 g by mouth daily. To correct constipation.  Adjust dose over 1-2 months.  Goal = ~1 bowel movement / day  255 g  0  . lidocaine (XYLOCAINE) 5 % ointment Apply 1 application topically as needed.  35.44 g  0  . oxyCODONE 10 MG TABS Take 1-1.5 tablets (10-15 mg total) by mouth every 4  (four) hours as needed for pain.  50 tablet  0   No current facility-administered medications for this visit.      No Known Allergies    History reviewed. No pertinent family history.  History   Social History  . Marital Status: Single    Spouse Name: N/A    Number of Children: N/A  . Years of Education: N/A   Social History Main Topics  . Smoking status: Never Smoker   . Smokeless tobacco: Never Used  . Alcohol Use: 14.4 oz/week    24 Cans of beer per week  . Drug Use: No  . Sexual Activity: Yes   Other Topics Concern  . None   Social History Narrative  . None      REVIEW OF SYSTEMS - PERTINENT POSITIVES ONLY: Review of Systems - General ROS: negative for - chills, fever or weight loss Hematological and Lymphatic ROS: negative for - bleeding problems, blood clots or bruising Respiratory ROS: no cough, shortness of breath, or wheezing Cardiovascular ROS: no chest pain or dyspnea on exertion Gastrointestinal ROS: no abdominal pain, change in bowel habits, or black or bloody stools Genito-Urinary ROS: no dysuria, trouble voiding, or hematuria  EXAM: Filed Vitals:   12/14/12 1349  BP: 122/70  Pulse:   72  Temp: 98.3 F (36.8 C)  Resp: 15    General appearance: alert and cooperative Resp: clear to auscultation bilaterally Cardio: regular rate and rhythm GI: normal findings: soft, non-tender  Anal Exam Findings: purulent drainage noted at external opening in left anterior gluteal cleft    ASSESSMENT AND PLAN: Bobby Houston is a 22 y.o. 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  We discussed management of this, which is anal EUA with probable replacement of the seton and possible I&D.  I will give him some lidocaine ointment to help with his pain.  I have suggested that he do sitz baths as needed and keep the area clean and dry.      Recia Sons C Dhruti Ghuman, MD Colon and Rectal Surgery / General Surgery [facility]      Visit  Diagnoses: 1. Perianal fissure     Primary Care Physician: No PCP Per Patient  

## 2012-12-16 ENCOUNTER — Emergency Department (HOSPITAL_COMMUNITY)
Admission: EM | Admit: 2012-12-16 | Discharge: 2012-12-16 | Disposition: A | Discharge status: Home / Self Care | Payer: Managed Care, Other (non HMO) | Attending: Emergency Medicine | Admitting: Emergency Medicine

## 2012-12-16 ENCOUNTER — Encounter (HOSPITAL_BASED_OUTPATIENT_CLINIC_OR_DEPARTMENT_OTHER): Payer: Self-pay | Admitting: *Deleted

## 2012-12-16 ENCOUNTER — Encounter (HOSPITAL_COMMUNITY): Payer: Self-pay | Admitting: Emergency Medicine

## 2012-12-16 DIAGNOSIS — K603 Anal fistula, unspecified: Secondary | ICD-10-CM | POA: Insufficient documentation

## 2012-12-16 DIAGNOSIS — K6289 Other specified diseases of anus and rectum: Secondary | ICD-10-CM | POA: Insufficient documentation

## 2012-12-16 MED ORDER — HYDROMORPHONE HCL PF 1 MG/ML IJ SOLN
1.0000 mg | Freq: Once | INTRAMUSCULAR | Status: AC
  Administered 2012-12-16: 1 mg via INTRAMUSCULAR
  Filled 2012-12-16: qty 1

## 2012-12-16 MED ORDER — OXYCODONE HCL 10 MG PO TABS: 10.0000 mg | ORAL_TABLET | ORAL | Status: DC | PRN

## 2012-12-16 NOTE — ED Notes (Signed)
Pt. reports progressing left inner buttock abscess with drainage this evening.

## 2012-12-16 NOTE — ED Provider Notes (Signed)
CSN: [id number]     Arrival date & time 12/16/12  0223 History   First MD Initiated Contact with Patient 12/16/12 0229     Chief Complaint  Patient presents with  . Abscess   (Consider location/radiation/quality/duration/timing/severity/associated sxs/prior Treatment) Patient is a 22 y.o. male presenting with abscess. The history is provided by the patient.  Abscess Associated symptoms: no headaches, no nausea and no vomiting    patient has a perianal abscess. He has had an incision and drainage and has had a drain placed. The drain has come out. He also has seen a colorectal surgeon yesterday. She gave him lidocaine jelly and he does not think this will help. He states that the pain is continued. He states it is swollen and started to drain. He states he would like to now. The plan is to go to the operating room in 2 weeks. No fevers. Minimal abdominal pain. He had previously been on pain medicines.   Past Medical History  Diagnosis Date  . Nausea with vomiting 11/04/2012    Post discharge with severe pain.   Past Surgical History  Procedure Laterality Date  . Irrigation and debridement abscess N/A 11/02/2012    Procedure: IRRIGATION AND DEBRIDEMENT RIGHT BUTTOCK BSCESS;  Surgeon: Atilano Ina, MD;  Location: [facility] OR;  Service: General;  Laterality: N/A;  . Placement of seton N/A 11/02/2012    Procedure: PLACEMENT OF SETON;  Surgeon: Atilano Ina, MD;  Location: [facility] OR;  Service: General;  Laterality: N/A;  . Examination under anesthesia N/A 11/02/2012    Procedure: Francia Greaves UNDER ANESTHESIA;  Surgeon: Atilano Ina, MD;  Location: [facility] OR;  Service: General;  Laterality: N/A;   No family history on file. History  Substance Use Topics  . Smoking status: Never Smoker   . Smokeless tobacco: Never Used  . Alcohol Use: 14.4 oz/week    24 Cans of beer per week    Review of Systems  Constitutional: Negative for activity change and appetite change.  Eyes: Negative for pain.  Respiratory:  Negative for chest tightness and shortness of breath.   Cardiovascular: Negative for chest pain and leg swelling.  Gastrointestinal: Positive for rectal pain. Negative for nausea, vomiting, abdominal pain and diarrhea.  Genitourinary: Negative for flank pain and penile pain.  Musculoskeletal: Negative for back pain and neck stiffness.  Skin: Positive for wound. Negative for rash.  Neurological: Negative for weakness, numbness and headaches.  Psychiatric/Behavioral: Negative for behavioral problems.    Allergies  Review of patient's allergies indicates no known allergies.  Home Medications   Current Outpatient Rx  Name  Route  Sig  Dispense  Refill  . lidocaine (XYLOCAINE) 5 % ointment   Topical   Apply 1 application topically as needed.   35.44 g   0   . Oxycodone HCl 10 MG TABS   Oral   Take 1-1.5 tablets (10-15 mg total) by mouth every 4 (four) hours as needed (pain).   20 tablet   0    BP 97/43  Pulse 52  Temp(Src) 97.9 F (36.6 C) (Oral)  Resp 16  SpO2 97% Physical Exam  Nursing note and vitals reviewed. Constitutional: He is oriented to person, place, and time. He appears well-developed and well-nourished.  HENT:  Head: Normocephalic and atraumatic.  Eyes: EOM are normal. Pupils are equal, round, and reactive to light.  Neck: Normal range of motion. Neck supple.  Cardiovascular: Normal rate, regular rhythm and normal heart sounds.   No murmur  heard. Pulmonary/Chest: Effort normal and breath sounds normal.  Abdominal: Soft. Bowel sounds are normal. He exhibits no distension and no mass. There is no tenderness. There is no rebound and no guarding.  Genitourinary:  Draining fistula on his right buttock. There is approximately 3 x 1 cm mass.  Musculoskeletal: Normal range of motion. He exhibits no edema.  Neurological: He is alert and oriented to person, place, and time. No cranial nerve deficit.  Skin: Skin is warm and dry.  Psychiatric: He has a normal mood and  affect.    ED Course  Procedures (including critical care time) Labs Review Labs Reviewed - No data to display Imaging Review No results found.  EKG Interpretation   None       MDM   1. Perianal fistula    Patient with known perianal fistula. He is not on pain medicines. Pain is uncontrolled. Discussed with Dr. Janee Morn. He recommended against incision and drainage at this time. He recommended starting pain medicines for the patient and have him follow with Dr. Maisie Fus.    [name]. Rubin Payor, MD 12/16/12 [phone]

## 2012-12-16 NOTE — Progress Notes (Signed)
NPO AFTER MN WITH EXCEPTION WATER/ GATORADE UNTIL 0700. ARRIVE AT 1130. NEEDS HG. MAY TAKE PAIN RX IF NEEDED AM DOS W/ SIPS OF WATER.

## 2012-12-21 ENCOUNTER — Ambulatory Visit (HOSPITAL_BASED_OUTPATIENT_CLINIC_OR_DEPARTMENT_OTHER)
Admission: RE | Admit: 2012-12-21 | Discharge: 2012-12-21 | Disposition: A | Discharge status: Home / Self Care | Payer: Managed Care, Other (non HMO) | Source: Ambulatory Visit | Attending: General Surgery | Admitting: General Surgery

## 2012-12-21 ENCOUNTER — Ambulatory Visit (HOSPITAL_BASED_OUTPATIENT_CLINIC_OR_DEPARTMENT_OTHER): Payer: Managed Care, Other (non HMO) | Admitting: Anesthesiology

## 2012-12-21 ENCOUNTER — Encounter (HOSPITAL_BASED_OUTPATIENT_CLINIC_OR_DEPARTMENT_OTHER): Payer: Self-pay | Admitting: *Deleted

## 2012-12-21 ENCOUNTER — Encounter (HOSPITAL_BASED_OUTPATIENT_CLINIC_OR_DEPARTMENT_OTHER): Payer: Managed Care, Other (non HMO) | Admitting: Anesthesiology

## 2012-12-21 ENCOUNTER — Encounter (HOSPITAL_BASED_OUTPATIENT_CLINIC_OR_DEPARTMENT_OTHER)
Admission: RE | Disposition: A | Discharge status: Home / Self Care | Payer: Self-pay | Source: Ambulatory Visit | Attending: General Surgery

## 2012-12-21 DIAGNOSIS — K603 Anal fistula, unspecified: Secondary | ICD-10-CM | POA: Insufficient documentation

## 2012-12-21 DIAGNOSIS — K612 Anorectal abscess: Secondary | ICD-10-CM | POA: Insufficient documentation

## 2012-12-21 HISTORY — PX: EXAMINATION UNDER ANESTHESIA: SHX1540

## 2012-12-21 HISTORY — PX: IRRIGATION AND DEBRIDEMENT ABSCESS: SHX5252

## 2012-12-21 HISTORY — PX: PLACEMENT OF SETON: SHX6029

## 2012-12-21 HISTORY — DX: Anal fistula: K60.3

## 2012-12-21 HISTORY — DX: Anal fistula, unspecified: K60.30

## 2012-12-21 LAB — POCT HEMOGLOBIN-HEMACUE: Hemoglobin: 13.8 g/dL (ref 13.0–17.0)

## 2012-12-21 SURGERY — EXAM UNDER ANESTHESIA
Anesthesia: Monitor Anesthesia Care | Site: Rectum | Wound class: Contaminated

## 2012-12-21 MED ORDER — MIDAZOLAM HCL 5 MG/5ML IJ SOLN
INTRAMUSCULAR | Status: DC | PRN
  Administered 2012-12-21: 2 mg via INTRAVENOUS

## 2012-12-21 MED ORDER — OXYCODONE HCL 5 MG PO TABS
5.0000 mg | ORAL_TABLET | ORAL | Status: DC | PRN
  Filled 2012-12-21: qty 2

## 2012-12-21 MED ORDER — SODIUM CHLORIDE 0.9 % IJ SOLN
3.0000 mL | Freq: Two times a day (BID) | INTRAMUSCULAR | Status: DC
  Filled 2012-12-21: qty 3

## 2012-12-21 MED ORDER — SODIUM CHLORIDE 0.9 % IV SOLN
250.0000 mg | INTRAVENOUS | Status: DC | PRN
  Administered 2012-12-21: 5 ug/kg/min via INTRAVENOUS

## 2012-12-21 MED ORDER — LACTATED RINGERS IV SOLN
INTRAVENOUS | Status: DC
  Administered 2012-12-21: 17:00:00 via INTRAVENOUS
  Filled 2012-12-21: qty 1000

## 2012-12-21 MED ORDER — LACTATED RINGERS IV SOLN
INTRAVENOUS | Status: DC
  Administered 2012-12-21 (×2): via INTRAVENOUS
  Filled 2012-12-21: qty 1000

## 2012-12-21 MED ORDER — PROPOFOL 10 MG/ML IV EMUL
INTRAVENOUS | Status: DC | PRN
  Administered 2012-12-21: 50 ug/kg/min via INTRAVENOUS

## 2012-12-21 MED ORDER — OXYCODONE HCL 10 MG PO TABS: 5.0000 mg | ORAL_TABLET | ORAL | Status: DC | PRN

## 2012-12-21 MED ORDER — LIDOCAINE 5 % EX OINT
TOPICAL_OINTMENT | CUTANEOUS | Status: DC | PRN
  Administered 2012-12-21: 1

## 2012-12-21 MED ORDER — FENTANYL CITRATE 0.05 MG/ML IJ SOLN
25.0000 ug | INTRAMUSCULAR | Status: DC | PRN
  Filled 2012-12-21: qty 1

## 2012-12-21 MED ORDER — SODIUM CHLORIDE 0.9 % IJ SOLN
3.0000 mL | INTRAMUSCULAR | Status: DC | PRN
  Filled 2012-12-21: qty 3

## 2012-12-21 MED ORDER — ONDANSETRON HCL 4 MG/2ML IJ SOLN
4.0000 mg | Freq: Four times a day (QID) | INTRAMUSCULAR | Status: DC | PRN
  Filled 2012-12-21: qty 2

## 2012-12-21 MED ORDER — 0.9 % SODIUM CHLORIDE (POUR BTL) OPTIME
TOPICAL | Status: DC | PRN
  Administered 2012-12-21: 500 mL

## 2012-12-21 MED ORDER — FENTANYL CITRATE 0.05 MG/ML IJ SOLN
INTRAMUSCULAR | Status: DC | PRN
  Administered 2012-12-21 (×2): 50 ug via INTRAVENOUS

## 2012-12-21 MED ORDER — ACETAMINOPHEN 650 MG RE SUPP
650.0000 mg | RECTAL | Status: DC | PRN
  Filled 2012-12-21: qty 1

## 2012-12-21 MED ORDER — SODIUM CHLORIDE 0.9 % IV SOLN
250.0000 mL | INTRAVENOUS | Status: DC | PRN
  Filled 2012-12-21: qty 250

## 2012-12-21 MED ORDER — ACETAMINOPHEN 325 MG PO TABS
650.0000 mg | ORAL_TABLET | ORAL | Status: DC | PRN
  Filled 2012-12-21: qty 2

## 2012-12-21 SURGICAL SUPPLY — 49 items
BANDAGE GAUZE ELAST BULKY 4 IN (GAUZE/BANDAGES/DRESSINGS) ×2 IMPLANT
BENZOIN TINCTURE PRP APPL 2/3 (GAUZE/BANDAGES/DRESSINGS) ×2 IMPLANT
BLADE HEX COATED 2.75 (ELECTRODE) ×2 IMPLANT
BLADE SURG 10 STRL SS (BLADE) IMPLANT
BLADE SURG 15 STRL LF DISP TIS (BLADE) ×1 IMPLANT
BLADE SURG 15 STRL SS (BLADE) ×1
BRIEF STRETCH FOR OB PAD LRG (UNDERPADS AND DIAPERS) ×4 IMPLANT
CANISTER SUCTION 2500CC (MISCELLANEOUS) ×2 IMPLANT
CLOTH BEACON ORANGE TIMEOUT ST (SAFETY) ×2 IMPLANT
COVER MAYO STAND STRL (DRAPES) ×2 IMPLANT
COVER TABLE BACK 60X90 (DRAPES) ×2 IMPLANT
DECANTER SPIKE VIAL GLASS SM (MISCELLANEOUS) ×2 IMPLANT
DRAPE LG THREE QUARTER DISP (DRAPES) ×2 IMPLANT
DRAPE PED LAPAROTOMY (DRAPES) ×2 IMPLANT
DRAPE UNDERBUTTOCKS STRL (DRAPE) IMPLANT
DRSG PAD ABDOMINAL 8X10 ST (GAUZE/BANDAGES/DRESSINGS) ×4 IMPLANT
ELECT BLADE 6.5 .24CM SHAFT (ELECTRODE) IMPLANT
ELECT REM PT RETURN 9FT ADLT (ELECTROSURGICAL) ×2
ELECTRODE REM PT RTRN 9FT ADLT (ELECTROSURGICAL) ×1 IMPLANT
GAUZE SPONGE 4X4 12PLY STRL LF (GAUZE/BANDAGES/DRESSINGS) ×2 IMPLANT
GAUZE SPONGE 4X4 16PLY XRAY LF (GAUZE/BANDAGES/DRESSINGS) ×2 IMPLANT
GAUZE VASELINE 3X9 (GAUZE/BANDAGES/DRESSINGS) IMPLANT
GLOVE BIO SURGEON STRL SZ 6.5 (GLOVE) ×4 IMPLANT
GLOVE INDICATOR 7.0 STRL GRN (GLOVE) ×4 IMPLANT
GOWN PREVENTION PLUS LG XLONG (DISPOSABLE) ×2 IMPLANT
GOWN PREVENTION PLUS XXLARGE (GOWN DISPOSABLE) ×2 IMPLANT
GOWN STRL REIN XL XLG (GOWN DISPOSABLE) ×4 IMPLANT
HEMOSTAT SURGICEL 2X14 (HEMOSTASIS) ×2 IMPLANT
LEGGING LITHOTOMY PAIR STRL (DRAPES) IMPLANT
LOOP VESSEL MAXI BLUE (MISCELLANEOUS) IMPLANT
NDL SAFETY ECLIPSE 18X1.5 (NEEDLE) IMPLANT
NEEDLE HYPO 18GX1.5 SHARP (NEEDLE)
NEEDLE HYPO 22GX1.5 SAFETY (NEEDLE) ×2 IMPLANT
NEEDLE HYPO 25X1 1.5 SAFETY (NEEDLE) ×2 IMPLANT
NS IRRIG 500ML POUR BTL (IV SOLUTION) ×2 IMPLANT
PACK BASIN DAY SURGERY FS (CUSTOM PROCEDURE TRAY) ×2 IMPLANT
PENCIL BUTTON HOLSTER BLD 10FT (ELECTRODE) ×2 IMPLANT
SPONGE GAUZE 4X4 12PLY (GAUZE/BANDAGES/DRESSINGS) IMPLANT
SPONGE SURGIFOAM ABS GEL 12-7 (HEMOSTASIS) IMPLANT
SUT CHROMIC 2 0 SH (SUTURE) IMPLANT
SUT CHROMIC 3 0 SH 27 (SUTURE) IMPLANT
SUT ETHIBOND 0 (SUTURE) IMPLANT
SUT VIC AB 4-0 P-3 18XBRD (SUTURE) IMPLANT
SUT VIC AB 4-0 P3 18 (SUTURE)
SYR CONTROL 10ML LL (SYRINGE) ×2 IMPLANT
TOWEL OR 17X24 6PK STRL BLUE (TOWEL DISPOSABLE) ×4 IMPLANT
TRAY DSU PREP LF (CUSTOM PROCEDURE TRAY) ×2 IMPLANT
TUBE CONNECTING 12X1/4 (SUCTIONS) ×2 IMPLANT
YANKAUER SUCT BULB TIP NO VENT (SUCTIONS) ×2 IMPLANT

## 2012-12-21 NOTE — Anesthesia Preprocedure Evaluation (Addendum)

## 2012-12-21 NOTE — Transfer of Care (Signed)
Immediate Anesthesia Transfer of Care Note  Patient: Bobby Houston  Procedure(s) Performed: Procedure(s) (LRB): ANAL EXAM UNDER ANESTHESIA (N/A) IRRIGATION AND DEBRIDEMENT  (N/A) PLACEMENT OF SETON (N/A)  Patient Location: PACU  Anesthesia Type: MAC  Level of Consciousness: awake, alert , oriented and patient cooperative  Airway & Oxygen Therapy: Patient Spontanous Breathing and Patient connected to face mask oxygen  Post-op Assessment: Report given to PACU RN and Post -op Vital signs reviewed and stable  Post vital signs: Reviewed and stable  Complications: No apparent anesthesia complications

## 2012-12-21 NOTE — Interval H&P Note (Signed)
History and Physical Interval Note:  12/21/2012 4:10 PM  Bobby Houston  has presented today for surgery, with the diagnosis of fistula  The various methods of treatment have been discussed with the patient and family. After consideration of risks, benefits and other options for treatment, the patient has consented to  Procedure(s): ANAL EXAM UNDER ANESTHESIA (N/A) IRRIGATION AND DEBRIDEMENT  (N/A) PLACEMENT OF SETON (N/A) as a surgical intervention .  The patient's history has been reviewed, patient examined, no change in status, stable for surgery.  I have reviewed the patient's chart and labs.  Questions were answered to the patient's satisfaction.     Vanita Panda, MD  Colorectal and General Surgery [facility]

## 2012-12-21 NOTE — H&P (View-Only) (Signed)
Chief Complaint  Patient presents with  . 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 male who presents to the office with anorectal pain.  He is s/p I&D and seton placement, but the seton fell out on a trip.  Other symptoms include drainage with BM's.  This had been occurring for several weeks.  He has tried warm showers in the past with some success.  BM's makes the symptoms worse.   It is intermittent in nature.  His bowel habits are regular and his bowel movements are varied.  His fiber intake is minimal.  He reports intermittent drainage especially at night. He denies any FH of crohn's disease or UC.      Past Medical History  Diagnosis Date  . Nausea with vomiting 11/04/2012    Post discharge with severe pain.      Past Surgical History  Procedure Laterality Date  . Irrigation and debridement abscess N/A 11/02/2012    Procedure: IRRIGATION AND DEBRIDEMENT RIGHT BUTTOCK BSCESS;  Surgeon: Atilano Ina, MD;  Location: [facility] OR;  Service: General;  Laterality: N/A;  . Placement of seton N/A 11/02/2012    Procedure: PLACEMENT OF SETON;  Surgeon: Atilano Ina, MD;  Location: [facility] OR;  Service: General;  Laterality: N/A;  . Examination under anesthesia N/A 11/02/2012    Procedure: Francia Greaves UNDER ANESTHESIA;  Surgeon: Atilano Ina, MD;  Location: [facility] OR;  Service: General;  Laterality: N/A;        Current Outpatient Prescriptions  Medication Sig Dispense Refill  . Chlorhexidine Gluconate Cloth 2 % PADS Apply 6 each topically daily.  14 each  0  . docusate sodium 100 MG CAPS Take 100 mg by mouth 2 (two) times daily.  60 capsule  1  . polyethylene glycol powder (MIRALAX) powder Take 8.5-34 g by mouth daily. To correct constipation.  Adjust dose over 1-2 months.  Goal = ~1 bowel movement / day  255 g  0  . lidocaine (XYLOCAINE) 5 % ointment Apply 1 application topically as needed.  35.44 g  0  . oxyCODONE 10 MG TABS Take 1-1.5 tablets (10-15 mg total) by mouth every 4  (four) hours as needed for pain.  50 tablet  0   No current facility-administered medications for this visit.      No Known Allergies    History reviewed. No pertinent family history.  History   Social History  . Marital Status: Single    Spouse Name: N/A    Number of Children: N/A  . Years of Education: N/A   Social History Main Topics  . Smoking status: Never Smoker   . Smokeless tobacco: Never Used  . Alcohol Use: 14.4 oz/week    24 Cans of beer per week  . Drug Use: No  . Sexual Activity: Yes   Other Topics Concern  . None   Social History Narrative  . None      REVIEW OF SYSTEMS - PERTINENT POSITIVES ONLY: Review of Systems - General ROS: negative for - chills, fever or weight loss Hematological and Lymphatic ROS: negative for - bleeding problems, blood clots or bruising Respiratory ROS: no cough, shortness of breath, or wheezing Cardiovascular ROS: no chest pain or dyspnea on exertion Gastrointestinal ROS: no abdominal pain, change in bowel habits, or black or bloody stools Genito-Urinary ROS: no dysuria, trouble voiding, or hematuria  EXAM: Filed Vitals:   12/14/12 1349  BP: 122/70  Pulse:  72  Temp: 98.3 F (36.8 C)  Resp: 15    General appearance: alert and cooperative Resp: clear to auscultation bilaterally Cardio: regular rate and rhythm GI: normal findings: soft, non-tender  Anal Exam Findings: purulent drainage noted at external opening in left anterior gluteal cleft    ASSESSMENT AND PLAN: Bobby Houston is a 22 y.o. 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  We discussed management of this, which is anal EUA with probable replacement of the seton and possible I&D.  I will give him some lidocaine ointment to help with his pain.  I have suggested that he do sitz baths as needed and keep the area clean and dry.      Vanita Panda, MD Colon and Rectal Surgery / General Surgery [facility]      Visit  Diagnoses: 1. Perianal fissure     Primary Care Physician: No PCP Per Patient

## 2012-12-21 NOTE — Op Note (Signed)
12/21/2012  4:43 PM  PATIENT:  Bobby Houston  22 y.o. male  Patient Care Team: No Pcp Per Patient as PCP - General (General Practice)  PRE-OPERATIVE DIAGNOSIS:  Anorectal fistula  POST-OPERATIVE DIAGNOSIS:  Anorectal fistula  PROCEDURE:  Procedure(s): ANAL EXAM UNDER ANESTHESIA  PLACEMENT OF SETON  SURGEON:  Surgeon(s): Romie Levee, MD  ASSISTANT: none   ANESTHESIA:   IV sedation  EBL:  Total I/O In: 600 [I.V.:600] Out: -   DRAINS: none   SPECIMEN:  No Specimen  DISPOSITION OF SPECIMEN:  N/A  COUNTS:  YES  PLAN OF CARE: Discharge to home after PACU  PATIENT DISPOSITION:  PACU - hemodynamically stable.  INDICATION: this is a 22 year old male who is status post perirectal abscess. This developed into a fistula and a seton was placed by one of my partners, Dr. Andrey Campanile. His seton fell out and he was sent to me for evaluation. He continues to have purulent drainage and anorectal pain.  We are here today for exam under anesthesia and seton placement.   OR FINDINGS: left anterior anal fistula with draining abscess cavity  DESCRIPTION: the patient was identified in the preoperative holding area and taken to the OR where he was laid prone on the operating room table in jackknife position. MAC anesthesia was smoothly induced. The patient's buttocks were gently taped apart and the area was prepped and draped in usual sterile fashion. A surgical timeout was performed indicating the correct patient, procedure, positioning and preoperative antibiotics. SCDs were noted to be in place prior to the initiation of anesthesia. I began by performing a rectal block using half percent Marcaine with epinephrine. This was done in standard fashion. After this was completed a digital rectal exam revealed no masses. A Hill-Ferguson anoscope was inserted into the anal canal and anal canal is evaluated. There was minimal hemorrhoid disease. The anal mucosal appeared normal. A S-shaped fistula probe  was inserted into the external opening and easily passed intrarenally into the anal canal at the left lateral margin near the dentate line. An Ethibond suture was tied to the fistula probe and brought back through the fistula tract. The Ethibond suture was then tied to a vessel loop and brought through the fistula tract as well. The vessel loop was secured with the Ethibond suture x3. An area of skin was excised around the external opening to allow for adequate drainage. Hemostasis was achieved with the Bovie cautery.  This completed the procedure. Lidocaine ointment and sterile dressing were applied. The patient was then awakened from anesthesia and sent to the post anesthesia care unit in stable condition. All counts were correct operating room staff.

## 2012-12-22 ENCOUNTER — Encounter (HOSPITAL_BASED_OUTPATIENT_CLINIC_OR_DEPARTMENT_OTHER): Payer: Self-pay | Admitting: General Surgery

## 2012-12-22 ENCOUNTER — Telehealth (INDEPENDENT_AMBULATORY_CARE_PROVIDER_SITE_OTHER): Payer: Self-pay | Admitting: *Deleted

## 2012-12-22 NOTE — Telephone Encounter (Signed)
I called Bobby Houston to check on him postoperatively, he has no complaints.  I informed him of his post op appt with Dr. Maisie Fus on 01/16/13 with an arrival time of 10:00am.  Bobby Houston agreeable to this appt.

## 2012-12-22 NOTE — Anesthesia Postprocedure Evaluation (Signed)
  Anesthesia Post-op Note  Patient: Bobby Houston  Procedure(s) Performed: Procedure(s) (LRB): ANAL EXAM UNDER ANESTHESIA (N/A) IRRIGATION AND DEBRIDEMENT  (N/A) PLACEMENT OF SETON (N/A)  Patient Location: PACU  Anesthesia Type: MAC  Level of Consciousness: awake and alert   Airway and Oxygen Therapy: Patient Spontanous Breathing  Post-op Pain: mild  Post-op Assessment: Post-op Vital signs reviewed, Patient's Cardiovascular Status Stable, Respiratory Function Stable, Patent Airway and No signs of Nausea or vomiting  Last Vitals:  Filed Vitals:   12/21/12 1820  BP: 120/72  Pulse: 52  Temp: 36.2 C  Resp: 18    Post-op Vital Signs: stable   Complications: No apparent anesthesia complications

## 2013-01-16 ENCOUNTER — Ambulatory Visit (INDEPENDENT_AMBULATORY_CARE_PROVIDER_SITE_OTHER): Payer: Managed Care, Other (non HMO) | Admitting: General Surgery

## 2013-01-16 ENCOUNTER — Encounter (INDEPENDENT_AMBULATORY_CARE_PROVIDER_SITE_OTHER): Payer: Self-pay

## 2013-01-16 ENCOUNTER — Encounter (INDEPENDENT_AMBULATORY_CARE_PROVIDER_SITE_OTHER): Payer: Self-pay | Admitting: General Surgery

## 2013-01-16 VITALS — BP 120/60 | HR 72 | Temp 98.6°F | Resp 14 | Ht 68.0 in | Wt 201.6 lb

## 2013-01-16 DIAGNOSIS — K603 Anal fistula, unspecified: Secondary | ICD-10-CM

## 2013-01-16 DIAGNOSIS — K602 Anal fissure, unspecified: Secondary | ICD-10-CM

## 2013-01-16 MED ORDER — LIDOCAINE 5 % EX OINT: 1.0000 "application " | TOPICAL_OINTMENT | CUTANEOUS | Status: DC | PRN

## 2013-01-16 NOTE — Progress Notes (Signed)
Bobby Houston is a 22 y.o. male who is status post a seton placement on 11/12 for a L ant fistula.  He is having some pain at night.  He is having regular BM's.  He is not really doing many sitz baths.   Objective: There were no vitals filed for this visit.  General appearance: alert and cooperative GI: normal findings: soft, non-tender  Incision: healing well, tissue soft, min drainage   Assessment: s/p  Patient Active Problem List   Diagnosis Date Noted  . 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 M with a perianal fistula ~ 2 mo s/p seton placement.  We discussed surgical options briefly today, including fistulotomy and MAF.  I will see him back in mid Jan to discuss these further.     Vanita Panda, MD [facility], [place] [phone]   01/16/2013 10:09 AM

## 2013-01-16 NOTE — Patient Instructions (Signed)
Controlling anal pain: Use the Sitz bath or shower after every bowel movement and at night to control pain.  Use the lidocaine ointment, tylenol and ibuprofen to control pain during the day.

## 2013-02-13 ENCOUNTER — Encounter (HOSPITAL_COMMUNITY): Payer: Self-pay | Admitting: Emergency Medicine

## 2013-02-13 ENCOUNTER — Emergency Department (HOSPITAL_COMMUNITY)
Admission: EM | Admit: 2013-02-13 | Discharge: 2013-02-13 | Disposition: A | Discharge status: Home / Self Care | Payer: Managed Care, Other (non HMO) | Attending: Emergency Medicine | Admitting: Emergency Medicine

## 2013-02-13 DIAGNOSIS — K603 Anal fistula, unspecified: Secondary | ICD-10-CM | POA: Insufficient documentation

## 2013-02-13 DIAGNOSIS — Z79899 Other long term (current) drug therapy: Secondary | ICD-10-CM | POA: Insufficient documentation

## 2013-02-13 LAB — CBC WITH DIFFERENTIAL/PLATELET
BASOS PCT: 1 % (ref 0–1)
Basophils Absolute: 0 10*3/uL (ref 0.0–0.1)
Eosinophils Absolute: 0.2 10*3/uL (ref 0.0–0.7)
Eosinophils Relative: 3 % (ref 0–5)
HCT: 38.4 % — ABNORMAL LOW (ref 39.0–52.0)
HEMOGLOBIN: 13 g/dL (ref 13.0–17.0)
Lymphocytes Relative: 44 % (ref 12–46)
Lymphs Abs: 2.7 10*3/uL (ref 0.7–4.0)
MCH: 25.9 pg — AB (ref 26.0–34.0)
MCHC: 33.9 g/dL (ref 30.0–36.0)
MCV: 76.6 fL — ABNORMAL LOW (ref 78.0–100.0)
MONOS PCT: 10 % (ref 3–12)
Monocytes Absolute: 0.6 10*3/uL (ref 0.1–1.0)
NEUTROS ABS: 2.6 10*3/uL (ref 1.7–7.7)
NEUTROS PCT: 42 % — AB (ref 43–77)
PLATELETS: 211 10*3/uL (ref 150–400)
RBC: 5.01 MIL/uL (ref 4.22–5.81)
RDW: 13.7 % (ref 11.5–15.5)
WBC: 6.1 10*3/uL (ref 4.0–10.5)

## 2013-02-13 MED ORDER — HYDROCODONE-ACETAMINOPHEN 5-325 MG PO TABS: 1.0000 | ORAL_TABLET | ORAL | Status: DC | PRN

## 2013-02-13 MED ORDER — HYDROCODONE-ACETAMINOPHEN 5-325 MG PO TABS
1.0000 | ORAL_TABLET | Freq: Once | ORAL | Status: AC
  Administered 2013-02-13: 1 via ORAL
  Filled 2013-02-13: qty 1

## 2013-02-13 NOTE — ED Provider Notes (Signed)
CSN: [id number]     Arrival date & time 02/13/13  0229 History   First MD Initiated Contact with Patient 02/13/13 0320     Chief Complaint  Patient presents with  . Abscess   (Consider location/radiation/quality/duration/timing/severity/associated sxs/prior Treatment) HPI Comments: Patient here with seton for anorectal fistula placed by Dr. Maisie Fushomas on 12/23/2012.  He comes to the hospital for worsening pain in the area - he states that he last saw Dr. Maisie Fushomas 2 weeks ago - that she ordered ibuprofen and lidocaine for the area to treat his pain - he is supposed to be seen in mid January for surgery (fistulotomy).  He reports no fever, chills, change to the drainage, he states that he does notice a small amount of stool in the purulent drainage but no worsening of this.  He reports the main reason he is here is because of the pain.  He denies abdominal pain, nausea, vomiting, diarrhea, constipation.  Patient is a 23 y.o. male presenting with abscess. The history is provided by the patient. No language interpreter was used.  Abscess Location:  Ano-genital Ano-genital abscess location:  L buttock Abscess quality: draining and painful   Abscess quality: no fluctuance, no induration and no redness   Red streaking: no   Duration:  2 weeks Progression:  Worsening Pain details:    Quality:  Sharp, pressure and throbbing   Severity:  Moderate   Duration:  2 weeks   Timing:  Constant   Progression:  Worsening Chronicity:  Chronic Relieved by:  Nothing Worsened by:  Nothing tried Ineffective treatments:  None tried Associated symptoms: no anorexia, no fatigue, no fever, no headaches, no nausea and no vomiting     Past Medical History  Diagnosis Date  . Perianal fistula    Past Surgical History  Procedure Laterality Date  . Irrigation and debridement abscess N/A 11/02/2012    Procedure: IRRIGATION AND DEBRIDEMENT RIGHT BUTTOCK BSCESS;  Surgeon: Atilano InaEric M Wilson, MD;  [facility];  Service:  General;  Laterality: N/A;  . Placement of seton N/A 11/02/2012    Procedure: PLACEMENT OF SETON;  Surgeon: Atilano InaEric M Wilson, MD;  [facility];  Service: General;  Laterality: N/A;  . Examination under anesthesia N/A 11/02/2012    Procedure: Francia GreavesEXAM UNDER ANESTHESIA;  Surgeon: Atilano InaEric M Wilson, MD;  [facility];  Service: General;  Laterality: N/A;  . Examination under anesthesia N/A 12/21/2012    Procedure: ANAL EXAM UNDER ANESTHESIA;  Surgeon: Romie LeveeAlicia Thomas, MD;  [facility] [place];  Service: General;  Laterality: N/A;  . Irrigation and debridement abscess N/A 12/21/2012    Procedure: IRRIGATION AND DEBRIDEMENT ;  Surgeon: Romie LeveeAlicia Thomas, MD;  [facility] [place];  Service: General;  Laterality: N/A;  . Placement of seton N/A 12/21/2012    Procedure: PLACEMENT OF SETON;  Surgeon: Romie LeveeAlicia Thomas, MD;  [facility] [place];  Service: General;  Laterality: N/A;   No family history on file. History  Substance Use Topics  . Smoking status: Never Smoker   . Smokeless tobacco: Never Used  . Alcohol Use: 3.6 oz/week    6 Cans of beer per week    Review of Systems  Constitutional: Negative for fever and fatigue.  Gastrointestinal: Negative for nausea, vomiting and anorexia.  Neurological: Negative for headaches.  All other systems reviewed and are negative.    Allergies  Review of patient's allergies indicates no known allergies.  Home Medications   Current Outpatient Rx  Name  Route  Sig  Dispense  Refill  . docusate sodium (COLACE) 100 MG capsule   Oral   Take 100 mg by mouth daily.         Marland Kitchen ibuprofen (ADVIL,MOTRIN) 200 MG tablet   Oral   Take 200 mg by mouth every 6 (six) hours as needed (pain).         Marland Kitchen lidocaine (XYLOCAINE) 5 % ointment   Topical   Apply 1 application topically as needed.   35.44 g   0   . Oxycodone HCl 10 MG TABS   Oral   Take 0.5-1 tablets (5-10 mg total) by mouth every 4 (four) hours as needed  (pain).   30 tablet   0    BP 132/72  Pulse 62  Temp(Src) 98.1 F (36.7 C) (Oral)  Resp 15  Ht 5\' 8"  (1.727 m)  Wt 205 lb (92.987 kg)  BMI 31.18 kg/m2  SpO2 100% Physical Exam  Nursing note and vitals reviewed. Constitutional: He is oriented to person, place, and time. He appears well-developed and well-nourished. No distress.  HENT:  Head: Normocephalic and atraumatic.  Right Ear: External ear normal.  Left Ear: External ear normal.  Nose: Nose normal.  Mouth/Throat: Oropharynx is clear and moist. No oropharyngeal exudate.  Eyes: Conjunctivae are normal. Pupils are equal, round, and reactive to light. No scleral icterus.  Neck: Normal range of motion. Neck supple.  Cardiovascular: Normal rate, regular rhythm and normal heart sounds.  Exam reveals no gallop and no friction rub.   No murmur heard. Pulmonary/Chest: Effort normal and breath sounds normal. No respiratory distress. He has no wheezes. He has no rales. He exhibits no tenderness.  Abdominal: Soft. Bowel sounds are normal. He exhibits no distension. There is no tenderness. There is no rebound and no guarding.  Genitourinary: Testes normal and penis normal. Rectal exam shows tenderness. Cremasteric reflex is present. Right testis shows no mass, no swelling and no tenderness. Left testis shows no mass, no swelling and no tenderness. Circumcised.     Musculoskeletal: Normal range of motion. He exhibits no edema and no tenderness.  Lymphadenopathy:    He has no cervical adenopathy.  Neurological: He is alert and oriented to person, place, and time. He exhibits normal muscle tone. Coordination normal.  Skin: Skin is warm and dry. No rash noted. No erythema. No pallor.  Psychiatric: He has a normal mood and affect. His behavior is normal. Judgment and thought content normal.    ED Course  Procedures (including critical care time) Labs Review Labs Reviewed  CBC WITH DIFFERENTIAL - Abnormal; Notable for the following:     HCT 38.4 (*)    MCV 76.6 (*)    MCH 25.9 (*)    Neutrophils Relative % 42 (*)    All other components within normal limits   Imaging Review No results found.  EKG Interpretation   None      Results for orders placed during the hospital encounter of 02/13/13  CBC WITH DIFFERENTIAL      Result Value Range   WBC 6.1  4.0 - 10.5 K/uL   RBC 5.01  4.22 - 5.81 MIL/uL   Hemoglobin 13.0  13.0 - 17.0 g/dL   HCT 16.1 (*) 09.6 - 04.5 %   MCV 76.6 (*) 78.0 - 100.0 fL   MCH 25.9 (*) 26.0 - 34.0 pg   MCHC 33.9  30.0 - 36.0 g/dL   RDW 40.9  81.1 - 91.4 %  Platelets 211  150 - 400 K/uL   Neutrophils Relative % 42 (*) 43 - 77 %   Neutro Abs 2.6  1.7 - 7.7 K/uL   Lymphocytes Relative 44  12 - 46 %   Lymphs Abs 2.7  0.7 - 4.0 K/uL   Monocytes Relative 10  3 - 12 %   Monocytes Absolute 0.6  0.1 - 1.0 K/uL   Eosinophils Relative 3  0 - 5 %   Eosinophils Absolute 0.2  0.0 - 0.7 K/uL   Basophils Relative 1  0 - 1 %   Basophils Absolute 0.0  0.0 - 0.1 K/uL   No results found.   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  Patient given hydrocodone here for the pain and will give short course of this.  Patient to follow up with Dr. Maisie Fus about further pain control.  He reports no further increase in the drainage, reports burning type pain but there is no skin excoriation.    Izola Price Marisue Humble, PA-C 02/13/13 0425

## 2013-02-13 NOTE — ED Notes (Signed)
Pt c/o drainage from abscess to rectum, pt had surgery to same in 11/14. Pt states pain and burning worse at this time with increased drainage.

## 2013-02-13 NOTE — ED Notes (Signed)
Patient is alert and oriented x3.  He was given DC instructions and follow up visit instructions.  Patient gave verbal understanding.  He was DC ambulatory under his own power to home.  V/S stable.  He was not showing any signs of distress on DC 

## 2013-02-13 NOTE — Discharge Instructions (Signed)
Anal Fistula °An anal fistula is an abnormal tunnel that develops between the bowel and skin near the outside of the anus, where feces comes out. The anus has a number of tiny glands that make lubricating fluid. Sometimes these glands can become plugged and infected. This may lead to the development of a fluid-filled pocket (abscess). An anal fistula often develops after this infection or abscess. It is nearly always caused by a past or current anal abscess.  °CAUSES  °Though an anal fistula is almost always caused by a past or current anal abscess, other causes can include: °· A complication of surgery. °· Trauma to the rectal area. °· Radiation to the area. °· Other medical conditions or diseases, such as:   °· Chronic inflammatory bowel disease, such as Crohn disease or ulcerative colitis.   °· Colon or rectal cancer.   °· Diverticular disease, such as diverticulitis.   °· A sexually transmitted disease, such as gonorrhea, chlamydia, or syphilis. °· An HIV infection or AIDS.   °SYMPTOMS  °· Throbbing or constant pain that may be worse when sitting.   °· Swelling or irritation around the anus.   °· Drainage of pus or blood from an opening near the anus.   °· Pain with bowel movements. °· Fever or chills. °DIAGNOSIS  °Your caregiver will examine the area to find the openings of the anal fistula and the fistula tract. The external opening of the anal fistula may be seen during a physical examination. Other examinations that may be performed include:  °· Examination of the rectal area with a gloved hand (digital rectal exam).   °· Examination with a probe or scope to help locate the internal opening of the fistula.   °· Injection of a dye into the fistula opening. X-rays can be taken to find the exact location and path of the fistula.   °· An MRI or ultrasound of the anal area.   °Other tests may be performed to find the cause of the anal fistula.    °TREATMENT  °The most common treatment for an anal fistula is  surgery. There are different surgery options depending on where your fistula is located and how complex the fistula is. Surgical options include: °· A fistulotomy. This surgery involves opening up the whole fistula and draining the contents inside to promote healing. °· Seton placement. A silk string (seton) is placed into the fistula during a fistulotomy to drain any infection to promote healing. °· Advancement flap procedure. Tissue is removed from your rectum or the skin around the anus and is attached to the opening of the fistula. °· Bioprosthetic plug. A cone-shaped plug is made from your tissue and is used to block the opening of the fistula. °Some anal fistulas do not require surgery. A fibrin glue is a non-surgical option that involves injecting the glue to seal the fistula. You also may be prescribed an antibiotic medicine to treat an infection.  °HOME CARE INSTRUCTIONS  °· Take your antibiotics as directed. Finish them even if you start to feel better. °· Only take over-the-counter or prescription medicines as directed by your caregiver. Use a stool softener or laxative, if recommended.   °· Eat a high-fiber diet to help avoid constipation or as directed by your caregiver. °· Drink enough water to keep your urine clear or pale yellow.   °· A warm sitz bath may be soothing and help with healing. You may take warm sitz baths for 15 20 minutes, 3 4 times a day to ease pain and discomfort.   °· Follow excellent hygiene to keep the   anal area as clean and dry as possible. Use wet toilet paper or moist towelettes after each bowel movement.   °SEEK MEDICAL CARE IF: °You have increased pain not controlled with medicines.  °SEEK IMMEDIATE MEDICAL CARE IF: °· You have severe, intolerable pain. °· You have new swelling, redness, or discharge around the anal area. °· You have tenderness or warmth around the anal area. °· You have chills or diarrhea. °· You have severe problems urinating or having a bowel movement.    °· You have a fever or persistent symptoms for more than 2 3 days.   °· You have a fever and your symptoms suddenly get worse.   °MAKE SURE YOU:  °· Understand these instructions. °· Will watch your condition. °· Will get help right away if you are not doing well or get worse. °Document Released: 01/09/2008 Document Revised: 01/13/2012 Document Reviewed: 12/01/2010 °Exit[employer]® Patient Information ©2014 [employer]. ° °

## 2013-02-13 NOTE — ED Provider Notes (Signed)
Medical screening examination/treatment/procedure(s) were performed by non-physician practitioner and as supervising physician I was immediately available for consultation/collaboration.  EKG Interpretation   None        Shon Batonourtney F Daryon Remmert, MD 02/13/13 [id number]

## 2013-03-01 ENCOUNTER — Ambulatory Visit (INDEPENDENT_AMBULATORY_CARE_PROVIDER_SITE_OTHER): Payer: Managed Care, Other (non HMO) | Admitting: General Surgery

## 2013-03-01 ENCOUNTER — Encounter (INDEPENDENT_AMBULATORY_CARE_PROVIDER_SITE_OTHER): Payer: Self-pay | Admitting: General Surgery

## 2013-03-01 ENCOUNTER — Encounter (INDEPENDENT_AMBULATORY_CARE_PROVIDER_SITE_OTHER): Payer: Self-pay

## 2013-03-01 VITALS — BP 140/80 | HR 72 | Temp 98.2°F | Resp 14 | Ht 68.0 in | Wt 200.8 lb

## 2013-03-01 DIAGNOSIS — K603 Anal fistula: Secondary | ICD-10-CM

## 2013-03-01 NOTE — Progress Notes (Signed)
Bobby Houston is a 23 y.o. male who is here for a follow up visit regarding his perirectal fistula.  He states that it has been draining well.  He continue to have pain.  He is not really doing his sitz baths.  He does get in the shower and let warm water run over it for a few minutes.  Requesting narcotics.  Objective: Filed Vitals:   03/01/13 1043  BP: 140/80  Pulse: 72  Temp: 98.2 F (36.8 C)  Resp: 14    General appearance: alert and cooperative GI: normal findings: soft, non-tender Perianal: small amt of feculent drainage, large amt of perirectal scar noted, no signs of recurrent abscess  Assessment and Plan: Return to the office in 1 month for re-evaluation. I discussed with him the importance of frequent sitz baths for pain control.  I do not think that narcotics are a good idea.    Vanita Panda.Anselma Herbel C Tarig Zimmers, MD [facility], [place] [phone]

## 2013-03-01 NOTE — Patient Instructions (Signed)
Sitz Bath   A sitz bath is a warm water bath taken in the sitting position that covers only the hips and buttocks. It may be used for either healing or hygiene purposes. Sitz baths are also used to relieve pain, itching, or muscle spasms. The water may contain medicine. Moist heat will help you heal and relax.  HOME CARE INSTRUCTIONS  Take 3 to 4 sitz baths a day.  1. Fill the bathtub half full with warm water. 2. Sit in the water and open the drain a little. 3. Turn on the warm water to keep the tub half full. Keep the water running constantly. 4. Soak in the water for 15 to 20 minutes. 5. After the sitz bath, pat the affected area dry first. SEEK MEDICAL CARE IF:  You get worse instead of better. Stop the sitz baths if you get worse.  MAKE SURE YOU:  Understand these instructions.  Will watch your condition.  Will get help right away if you are not doing well or get worse.

## 2013-03-23 ENCOUNTER — Encounter (INDEPENDENT_AMBULATORY_CARE_PROVIDER_SITE_OTHER): Payer: Managed Care, Other (non HMO) | Admitting: General Surgery

## 2013-03-23 ENCOUNTER — Encounter (INDEPENDENT_AMBULATORY_CARE_PROVIDER_SITE_OTHER): Payer: Self-pay

## 2013-03-30 ENCOUNTER — Encounter (INDEPENDENT_AMBULATORY_CARE_PROVIDER_SITE_OTHER): Payer: Managed Care, Other (non HMO) | Admitting: General Surgery

## 2013-04-18 ENCOUNTER — Telehealth (INDEPENDENT_AMBULATORY_CARE_PROVIDER_SITE_OTHER): Payer: Self-pay | Admitting: General Surgery

## 2013-04-18 ENCOUNTER — Ambulatory Visit (INDEPENDENT_AMBULATORY_CARE_PROVIDER_SITE_OTHER): Payer: Managed Care, Other (non HMO) | Admitting: General Surgery

## 2013-04-18 ENCOUNTER — Encounter (INDEPENDENT_AMBULATORY_CARE_PROVIDER_SITE_OTHER): Payer: Self-pay | Admitting: General Surgery

## 2013-04-18 VITALS — BP 134/74 | HR 77 | Temp 98.8°F | Resp 16 | Ht 66.0 in | Wt 206.0 lb

## 2013-04-18 DIAGNOSIS — K603 Anal fistula: Secondary | ICD-10-CM

## 2013-04-18 DIAGNOSIS — K605 Anorectal fistula: Secondary | ICD-10-CM

## 2013-04-18 NOTE — Telephone Encounter (Signed)
04/18/13 I met with patient and went over financial responsibility and benefits. He will call back to schedule. Placed in pending/skm

## 2013-04-18 NOTE — Progress Notes (Signed)
Chief Complaint  Patient presents with  . Routine Post Op    HISTORY:  Bobby Houston is a 23 y.o. male who presents to the office with an anorectal fistula.  He has had a seton in place for 4 months.  His pain is better.  He is still having some purulent drainage.    Past Medical History  Diagnosis Date  . Perianal fistula        Past Surgical History  Procedure Laterality Date  . Irrigation and debridement abscess N/A 11/02/2012    Procedure: IRRIGATION AND DEBRIDEMENT RIGHT BUTTOCK BSCESS;  Surgeon: Atilano InaEric M Wilson, MD;  [facility];  Service: General;  Laterality: N/A;  . Placement of seton N/A 11/02/2012    Procedure: PLACEMENT OF SETON;  Surgeon: Atilano InaEric M Wilson, MD;  [facility];  Service: General;  Laterality: N/A;  . Examination under anesthesia N/A 11/02/2012    Procedure: Francia GreavesEXAM UNDER ANESTHESIA;  Surgeon: Atilano InaEric M Wilson, MD;  [facility];  Service: General;  Laterality: N/A;  . Examination under anesthesia N/A 12/21/2012    Procedure: ANAL EXAM UNDER ANESTHESIA;  Surgeon: Romie LeveeAlicia Jamy Whyte, MD;  [facility] [place];  Service: General;  Laterality: N/A;  . Irrigation and debridement abscess N/A 12/21/2012    Procedure: IRRIGATION AND DEBRIDEMENT ;  Surgeon: Romie LeveeAlicia Amberrose Friebel, MD;  [facility] [place];  Service: General;  Laterality: N/A;  . Placement of seton N/A 12/21/2012    Procedure: PLACEMENT OF SETON;  Surgeon: Romie LeveeAlicia Jaidee Stipe, MD;  [facility] [place];  Service: General;  Laterality: N/A;      Current Outpatient Prescriptions  Medication Sig Dispense Refill  . docusate sodium (COLACE) 100 MG capsule Take 100 mg by mouth daily.      Marland Kitchen. HYDROcodone-acetaminophen (NORCO/VICODIN) 5-325 MG per tablet Take 1 tablet by mouth every 4 (four) hours as needed for moderate pain.  30 tablet  0  . ibuprofen (ADVIL,MOTRIN) 200 MG tablet Take 200 mg by mouth every 6 (six) hours as needed (pain).      . traMADol (ULTRAM) 50 MG tablet         No current facility-administered medications for this visit.     No Known Allergies    No family history on file.    History   Social History  . Marital Status: Single    Spouse Name: N/A    Number of Children: N/A  . Years of Education: N/A   Social History Main Topics  . Smoking status: Never Smoker   . Smokeless tobacco: Never Used  . Alcohol Use: 3.6 oz/week    6 Cans of beer per week  . Drug Use: No  . Sexual Activity: None   Other Topics Concern  . None   Social History Narrative  . None       REVIEW OF SYSTEMS - PERTINENT POSITIVES ONLY: Review of Systems - General ROS: negative for - chills or fever Respiratory ROS: no cough, shortness of breath, or wheezing Cardiovascular ROS: no chest pain or dyspnea on exertion Gastrointestinal ROS: no abdominal pain, change in bowel habits, or black or bloody stools Genito-Urinary ROS: no dysuria, trouble voiding, or hematuria  EXAM: Filed Vitals:   04/18/13 1149  BP: 134/74  Pulse: 77  Temp: 98.8 F (37.1 C)  Resp: 16    General appearance: alert and cooperative Resp: clear to auscultation bilaterally Cardio: regular rate and rhythm GI: normal findings: soft, non-tender Seton in place with good scar  tissue noted throughout tract   ASSESSMENT AND PLAN: Bobby Houston Is a 23 year old male with anal rectal fistula. He has had a seton in place for 3 months. He is ready for definitive treatment. We have elected to proceed with a lift procedure. We discussed the procedure in detail. We discussed other surgical options as well. We discussed that this does have a 15-20% chance of recurrence but is most likely his best option given the amount of muscle that the fistula and compresses. We discussed the risks of the surgery including internal fistula recurrence which will require another procedure as well as complete recurrence and incontinence due to scar tissue. I believe he understands these risks and has agreed to  proceed with surgery.    Vanita Panda, MD Colon and Rectal Surgery / General Surgery [facility]      Visit Diagnoses: 1. Anorectal fistula     Primary Care Physician: No PCP Per Patient

## 2013-05-28 ENCOUNTER — Encounter (HOSPITAL_COMMUNITY): Payer: Self-pay | Admitting: Emergency Medicine

## 2013-05-28 ENCOUNTER — Emergency Department (HOSPITAL_COMMUNITY)
Admission: EM | Admit: 2013-05-28 | Discharge: 2013-05-28 | Disposition: A | Discharge status: Home / Self Care | Payer: Managed Care, Other (non HMO) | Attending: Emergency Medicine | Admitting: Emergency Medicine

## 2013-05-28 DIAGNOSIS — Z9889 Other specified postprocedural states: Secondary | ICD-10-CM | POA: Insufficient documentation

## 2013-05-28 DIAGNOSIS — K603 Anal fistula, unspecified: Secondary | ICD-10-CM | POA: Insufficient documentation

## 2013-05-28 DIAGNOSIS — Z79899 Other long term (current) drug therapy: Secondary | ICD-10-CM | POA: Insufficient documentation

## 2013-05-28 MED ORDER — OXYCODONE-ACETAMINOPHEN 5-325 MG PO TABS
1.0000 | ORAL_TABLET | Freq: Once | ORAL | Status: DC
  Filled 2013-05-28: qty 1

## 2013-05-28 NOTE — ED Notes (Signed)
Pt not in room x 2. Gown found rumpled on end of bed

## 2013-05-28 NOTE — ED Notes (Signed)
Pt not in room. Left before receiving discharge paperwork

## 2013-05-28 NOTE — ED Provider Notes (Signed)
  Medical screening examination/treatment/procedure(s) were performed by non-physician practitioner and as supervising physician I was immediately available for consultation/collaboration.   EKG Interpretation None         Gerhard Munchobert Duana Benedict, MD 05/28/13 2320

## 2013-05-28 NOTE — ED Notes (Signed)
Patient presents with rectal bleeding.

## 2013-05-28 NOTE — ED Provider Notes (Signed)
CSN: [id number]     Arrival date & time 05/28/13  2139 History   First MD Initiated Contact with Patient 05/28/13 2206     Chief Complaint  Patient presents with  . Rectal Bleeding     (Consider location/radiation/quality/duration/timing/severity/associated sxs/prior Treatment) HPI Comments: Patient is 23 year old male who presents to the ED with complaints of rectal pain, he states that he is being followed by Dr. 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  He reports that this has since increased his pain.  He denies fever, chills, nausea, vomiting, rectal bleeding.  I have seen this patient in the past and he continues to complain of rectal pain.  The history is provided by the patient. No language interpreter was used.    Past Medical History  Diagnosis Date  . Perianal fistula    Past Surgical History  Procedure Laterality Date  . Irrigation and debridement abscess N/A 11/02/2012    Procedure: IRRIGATION AND DEBRIDEMENT RIGHT BUTTOCK BSCESS;  Surgeon: Atilano InaEric M Wilson, MD;  [facility];  Service: General;  Laterality: N/A;  . Placement of seton N/A 11/02/2012    Procedure: PLACEMENT OF SETON;  Surgeon: Atilano InaEric M Wilson, MD;  [facility];  Service: General;  Laterality: N/A;  . Examination under anesthesia N/A 11/02/2012    Procedure: Francia GreavesEXAM UNDER ANESTHESIA;  Surgeon: Atilano InaEric M Wilson, MD;  [facility];  Service: General;  Laterality: N/A;  . Examination under anesthesia N/A 12/21/2012    Procedure: ANAL EXAM UNDER ANESTHESIA;  Surgeon: Romie LeveeAlicia Thomas, MD;  [facility] [place];  Service: General;  Laterality: N/A;  . Irrigation and debridement abscess N/A 12/21/2012    Procedure: IRRIGATION AND DEBRIDEMENT ;  Surgeon: Romie LeveeAlicia Thomas, MD;  [facility];  Service: General;  Laterality: N/A;  . Placement of seton N/A 12/21/2012    Procedure: PLACEMENT OF SETON;  Surgeon: Romie LeveeAlicia Thomas, MD;  [facility] [place];  Service: General;  Laterality: N/A;   History reviewed. No pertinent family history. History  Substance Use Topics  . Smoking status: Never Smoker   . Smokeless tobacco: Never Used  . Alcohol Use: 3.6 oz/week    6 Cans of beer per week    Review of Systems  All other systems reviewed and are negative.     Allergies  Review of patient's allergies indicates no known allergies.  Home Medications   Prior to Admission medications   Medication Sig Start Date End Date Taking? Authorizing Provider  docusate sodium (COLACE) 100 MG capsule Take 100 mg by mouth daily.    Historical Provider, MD  HYDROcodone-acetaminophen (NORCO/VICODIN) 5-325 MG per tablet Take 1 tablet by mouth every 4 (four) hours as needed for moderate pain. 02/13/13   Izola PriceFrances C. Keaundre Thelin, PA-C  ibuprofen (ADVIL,MOTRIN) 200 MG tablet Take 200 mg by mouth every 6 (six) hours as needed (pain).    Historical Provider, MD  traMADol (ULTRAM) 50 MG tablet  02/10/13   Historical Provider, MD   BP 148/71  Pulse 63  Temp(Src) 98.2 F (36.8 C) (Oral)  Resp 22  Ht 5\' 8"  (1.727 m)  Wt 211 lb 3 oz (95.794 kg)  BMI 32.12 kg/m2  SpO2 96% Physical Exam  Nursing note and vitals reviewed.  Constitutional: He is oriented to person, place, and time. He appears well-developed and well-nourished. No distress.  HENT:  Head: Normocephalic and atraumatic.  Mouth/Throat: Oropharynx is clear and moist.  Eyes: Conjunctivae are normal. No scleral icterus.  Pulmonary/Chest: Effort normal.  Genitourinary:     Musculoskeletal: Normal range of motion. He exhibits no edema.  Neurological: He is alert and oriented to person, place, and time. He exhibits normal muscle tone. Coordination normal.  Skin: Skin is warm and dry. No rash noted. No erythema. No pallor.    Psychiatric: He has a normal mood and affect. His behavior is normal. Judgment and thought content normal.    ED Course  Procedures (including critical care time) Labs Review Labs Reviewed - No data to display  Imaging Review No results found.   EKG Interpretation None      MDM   Perianal fistula  Patient here with known perianal fistula now with seton string broken.  Discussed with Dr. Jeraldine LootsLockwood and we do not feel that this warrants emergent intervention this evening.  I have given the patient one pain pill here and he will follow up with Dr. Maisie Fushomas tomorrow for this.  Per review of her records, she does not recommend giving narcotics to this patient so he will not be receiving a prescription for this.    Izola PriceFrances C. Marisue HumbleSanford, [place] JerseyPA-C 05/28/13 2234

## 2013-05-28 NOTE — ED Notes (Signed)
Pt not in room x 1

## 2013-05-28 NOTE — ED Notes (Signed)
Patient presents stating he has a perianal fistula and feels like it has opened up

## 2013-05-28 NOTE — Discharge Instructions (Signed)
Anal Fistula °An anal fistula is an abnormal tunnel that develops between the bowel and skin near the outside of the anus, where feces comes out. The anus has a number of tiny glands that make lubricating fluid. Sometimes these glands can become plugged and infected. This may lead to the development of a fluid-filled pocket (abscess). An anal fistula often develops after this infection or abscess. It is nearly always caused by a past or current anal abscess.  °CAUSES  °Though an anal fistula is almost always caused by a past or current anal abscess, other causes can include: °· A complication of surgery. °· Trauma to the rectal area. °· Radiation to the area. °· Other medical conditions or diseases, such as:   °· Chronic inflammatory bowel disease, such as Crohn disease or ulcerative colitis.   °· Colon or rectal cancer.   °· Diverticular disease, such as diverticulitis.   °· A sexually transmitted disease, such as gonorrhea, chlamydia, or syphilis. °· An HIV infection or AIDS.   °SYMPTOMS  °· Throbbing or constant pain that may be worse when sitting.   °· Swelling or irritation around the anus.   °· Drainage of pus or blood from an opening near the anus.   °· Pain with bowel movements. °· Fever or chills. °DIAGNOSIS  °Your caregiver will examine the area to find the openings of the anal fistula and the fistula tract. The external opening of the anal fistula may be seen during a physical examination. Other examinations that may be performed include:  °· Examination of the rectal area with a gloved hand (digital rectal exam).   °· Examination with a probe or scope to help locate the internal opening of the fistula.   °· Injection of a dye into the fistula opening. X-rays can be taken to find the exact location and path of the fistula.   °· An MRI or ultrasound of the anal area.   °Other tests may be performed to find the cause of the anal fistula.    °TREATMENT  °The most common treatment for an anal fistula is  surgery. There are different surgery options depending on where your fistula is located and how complex the fistula is. Surgical options include: °· A fistulotomy. This surgery involves opening up the whole fistula and draining the contents inside to promote healing. °· Seton placement. A silk string (seton) is placed into the fistula during a fistulotomy to drain any infection to promote healing. °· Advancement flap procedure. Tissue is removed from your rectum or the skin around the anus and is attached to the opening of the fistula. °· Bioprosthetic plug. A cone-shaped plug is made from your tissue and is used to block the opening of the fistula. °Some anal fistulas do not require surgery. A fibrin glue is a non-surgical option that involves injecting the glue to seal the fistula. You also may be prescribed an antibiotic medicine to treat an infection.  °HOME CARE INSTRUCTIONS  °· Take your antibiotics as directed. Finish them even if you start to feel better. °· Only take over-the-counter or prescription medicines as directed by your caregiver. Use a stool softener or laxative, if recommended.   °· Eat a high-fiber diet to help avoid constipation or as directed by your caregiver. °· Drink enough water to keep your urine clear or pale yellow.   °· A warm sitz bath may be soothing and help with healing. You may take warm sitz baths for 15 20 minutes, 3 4 times a day to ease pain and discomfort.   °· Follow excellent hygiene to keep the   anal area as clean and dry as possible. Use wet toilet paper or moist towelettes after each bowel movement.   °SEEK MEDICAL CARE IF: °You have increased pain not controlled with medicines.  °SEEK IMMEDIATE MEDICAL CARE IF: °· You have severe, intolerable pain. °· You have new swelling, redness, or discharge around the anal area. °· You have tenderness or warmth around the anal area. °· You have chills or diarrhea. °· You have severe problems urinating or having a bowel movement.    °· You have a fever or persistent symptoms for more than 2 3 days.   °· You have a fever and your symptoms suddenly get worse.   °MAKE SURE YOU:  °· Understand these instructions. °· Will watch your condition. °· Will get help right away if you are not doing well or get worse. °Document Released: 01/09/2008 Document Revised: 01/13/2012 Document Reviewed: 12/01/2010 °Exit[employer]® Patient Information ©2014 [employer]. ° °

## 2015-04-30 IMAGING — CT CT PELVIS W/ CM
2 of 3 series · 17 of 46 positions shown, 19 images · IV contrast (APPLIED)
Comparison: None.

CLINICAL DATA: Recurring perianal abscess. History of the surgical
drainage.

EXAM:
CT PELVIS WITH CONTRAST
TECHNIQUE: Multidetector CT imaging of the pelvis was performed using the
standard protocol following the bolus administration of intravenous
contrast.
CONTRAST:  100mL OMNIPAQUE IOHEXOL 300 MG/ML  SOLN

[Series 2: abd/ pelvis 5.0 i30f 1 · axial · 0.78mm/px · z∈[+818,+1103]mm · 14 of 67 slices shown, 16 images]
[im 5/67  soft-tissue]
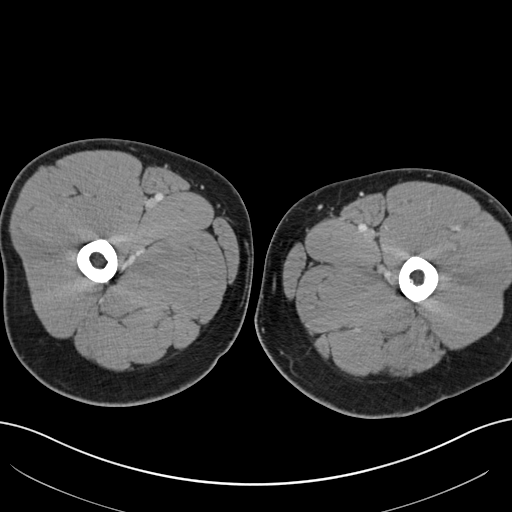
[im 5/67  bone]
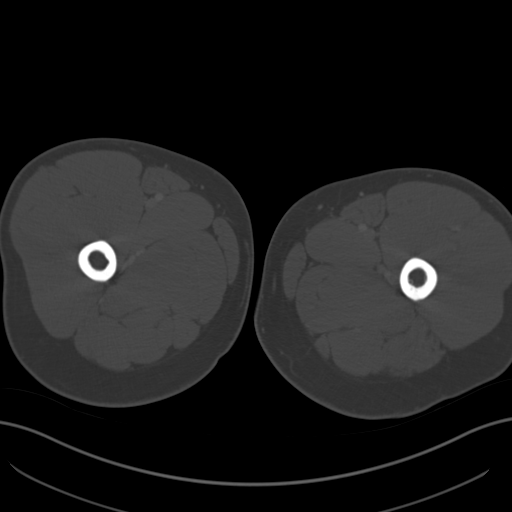
[im 9/67  soft-tissue]
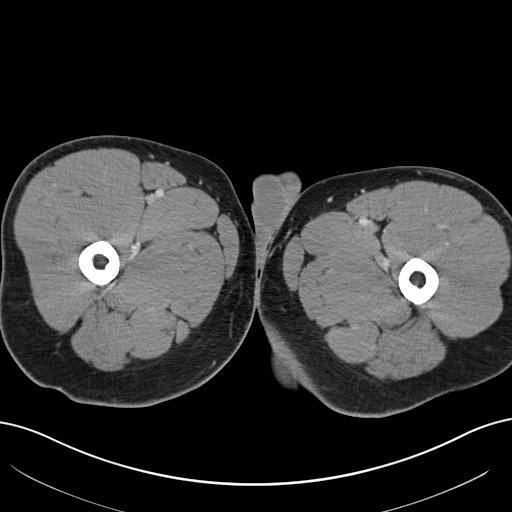
[im 13/67  soft-tissue]
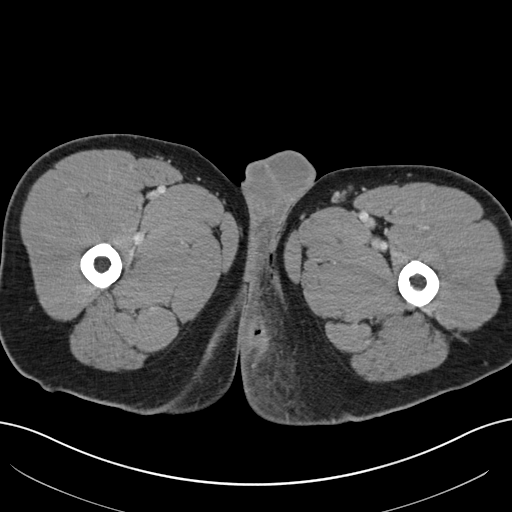
[im 18/67  soft-tissue]
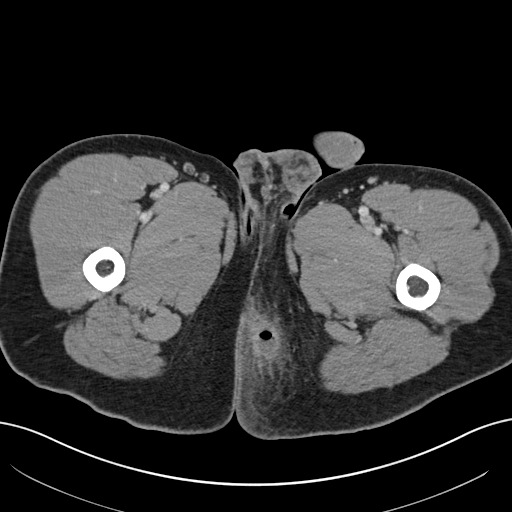
[im 22/67  soft-tissue]
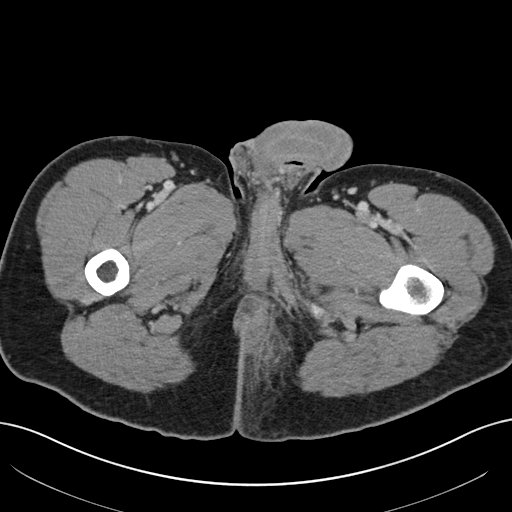
[im 26/67  soft-tissue]
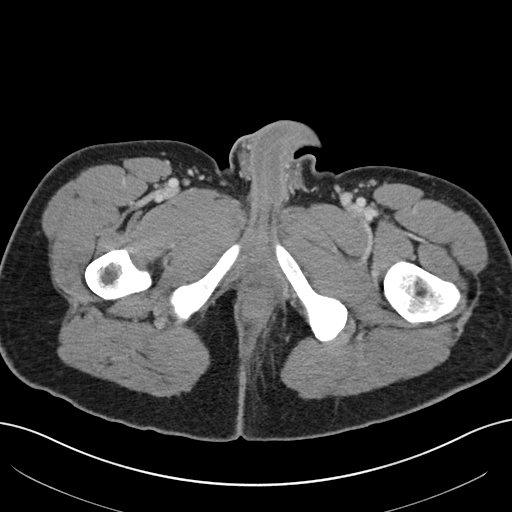
[im 30/67  soft-tissue]
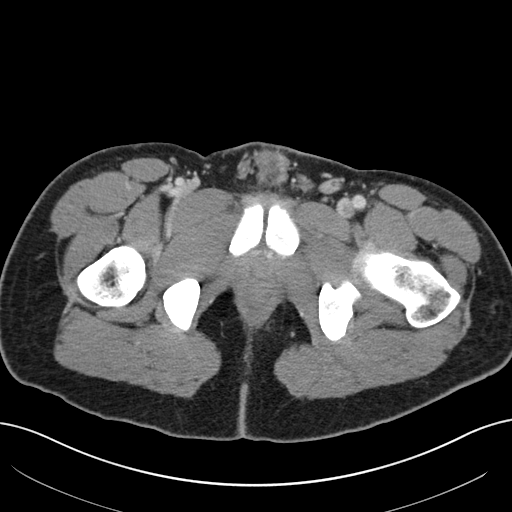
[im 37/67  soft-tissue]
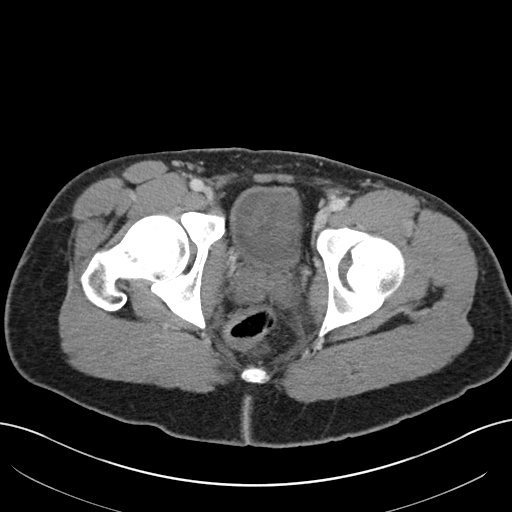
[im 41/67  soft-tissue]
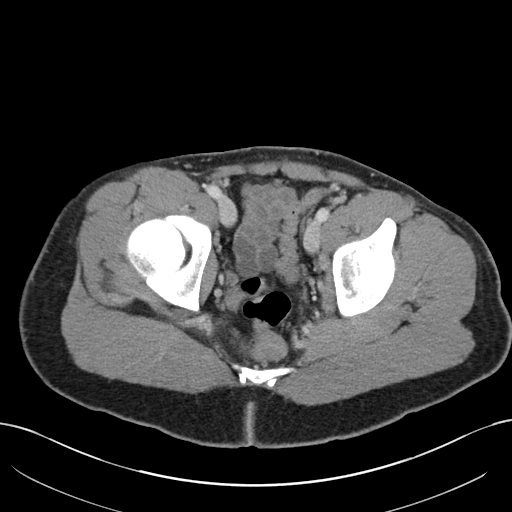
[im 41/67  bone]
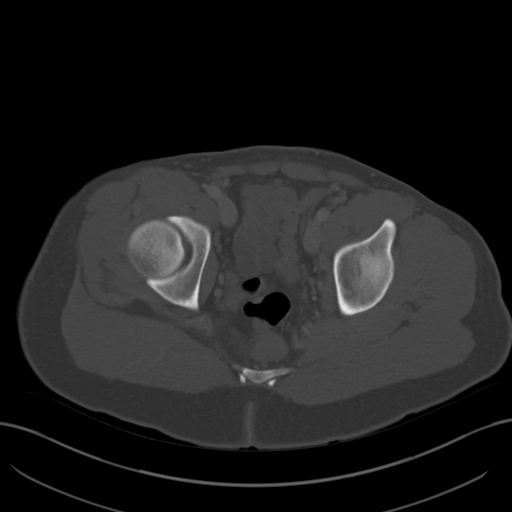
[im 45/67  soft-tissue]
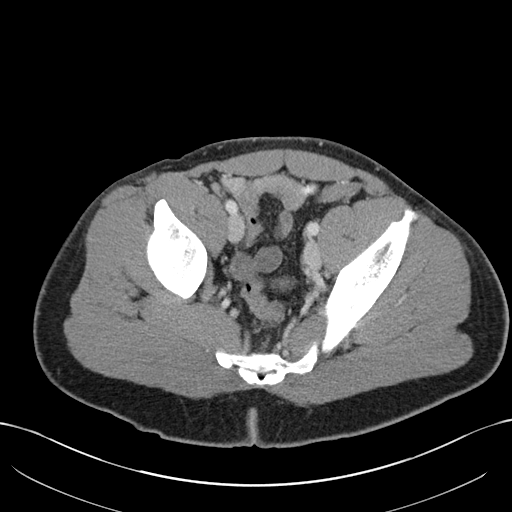
[im 49/67  soft-tissue]
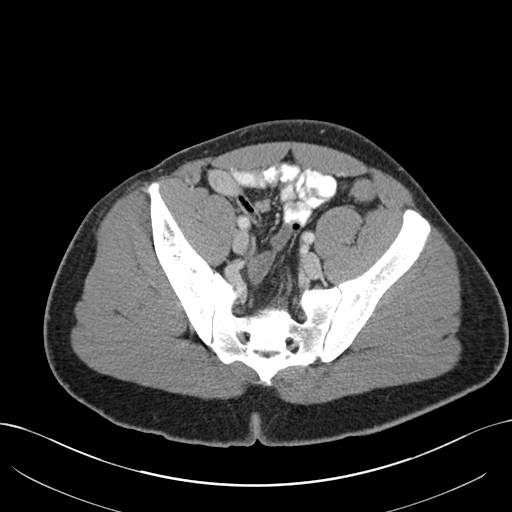
[im 54/67  soft-tissue]
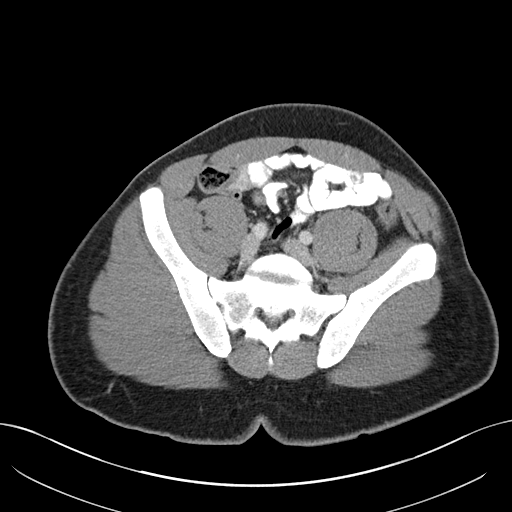
[im 58/67  soft-tissue]
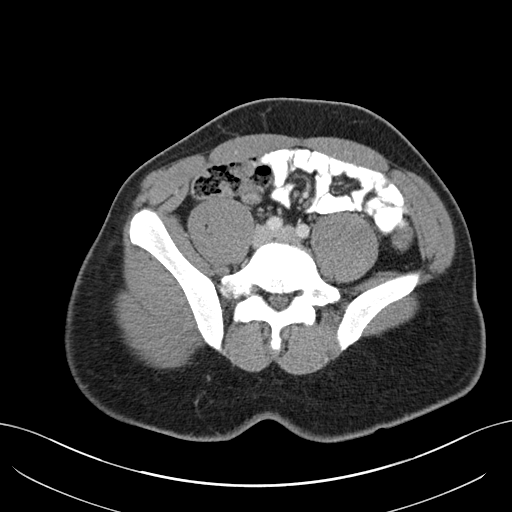
[im 62/67  soft-tissue]
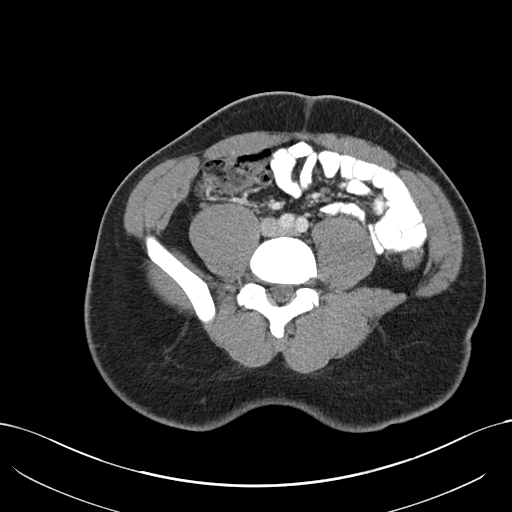

[Series 4: cor · coronal · 0.81mm/px · 3 of 167 slices shown]
[im 56/167  soft-tissue]
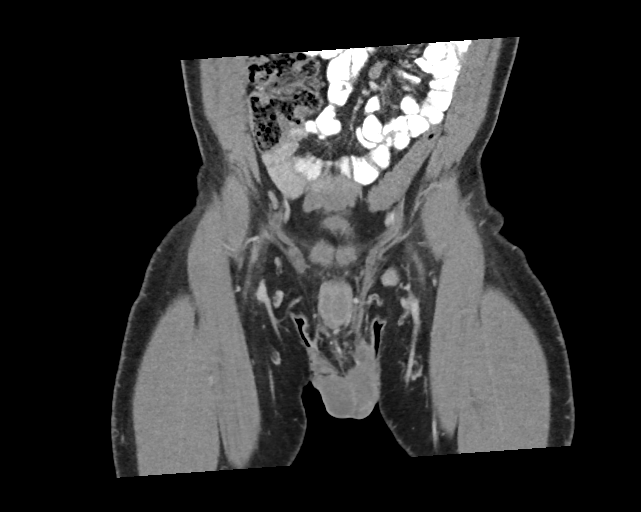
[im 74/167  soft-tissue]
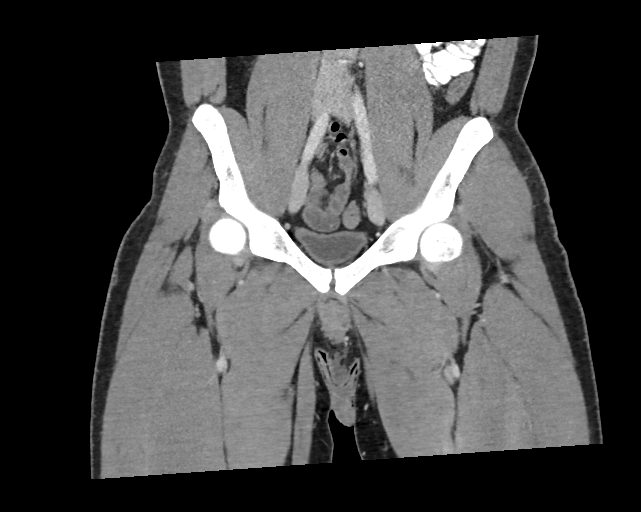
[im 93/167  soft-tissue]
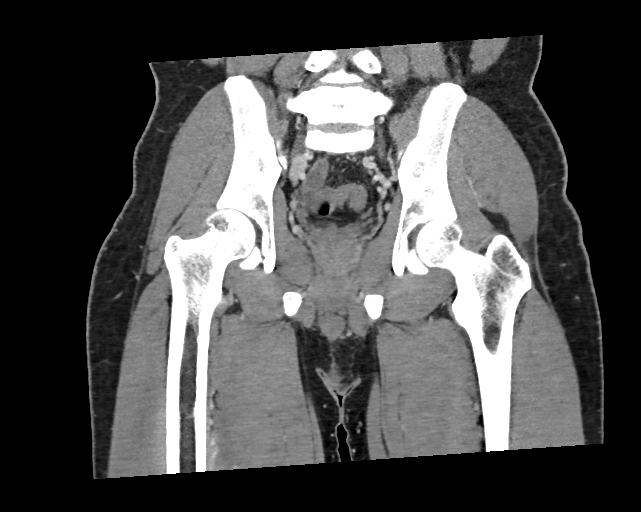

[17 of 46 positions shown; findings below may reference images not displayed]

FINDINGS: There is a perianal fistula on the left at 5 o'clock with soft
tissue stranding in the surrounding subcutaneous fat. There is a
small associated fluid collection measuring 2.3 cm maximally. Air
extends from the anus to perineal skin surface. No significant
inflammatory changes are present on the right.

The distal rectum and sigmoid colon appear normal. There are no
intrapelvic inflammatory changes or fluid collections. The appendix
appears normal. There is no pelvic or inguinal lymphadenopathy.

The urinary bladder, prostate gland and seminal vesicles appear
normal. There are no significant vascular findings.
IMPRESSION: Perianal fistula on the left with associated small perirectal
abscess and associated cellulitis. No intrapelvic inflammatory
change is demonstrated.

## 2017-06-21 ENCOUNTER — Ambulatory Visit (INDEPENDENT_AMBULATORY_CARE_PROVIDER_SITE_OTHER): Payer: No Typology Code available for payment source | Admitting: Orthopaedic Surgery

## 2017-06-21 ENCOUNTER — Ambulatory Visit (INDEPENDENT_AMBULATORY_CARE_PROVIDER_SITE_OTHER): Payer: No Typology Code available for payment source

## 2017-06-21 ENCOUNTER — Encounter (INDEPENDENT_AMBULATORY_CARE_PROVIDER_SITE_OTHER): Payer: Self-pay | Admitting: Orthopaedic Surgery

## 2017-06-21 DIAGNOSIS — M25532 Pain in left wrist: Secondary | ICD-10-CM

## 2017-06-21 NOTE — Progress Notes (Signed)
Office Visit Note   Patient: Bobby Houston           Date of Birth: Nov 03, 1990           MRN: [id number] Visit Date: 06/21/2017              Requested by: No referring provider defined for this encounter. PCP: Patient, No Pcp Per   Assessment & Plan: Visit Diagnoses:  1. Pain in left wrist     Plan: Impression is snapping of left ECU tendon with pain.  He states that he has already worn a wrist brace which failed to give him relief.  At this point we are going to refer him to a hand surgeon to discuss ECU stabilization surgery.  Questions encouraged and answered.  Follow-up as needed. Total face to face encounter time was greater than 45 minutes and over half of this time was spent in counseling and/or coordination of care.  Follow-Up Instructions: Return if symptoms worsen or fail to improve.   Orders:  Orders Placed This Encounter  Procedures  . XR Wrist Complete Left  . Ambulatory referral to Orthopedic Surgery   No orders of the defined types were placed in this encounter.     Procedures: No procedures performed   Clinical Data: No additional findings.   Subjective: Chief Complaint  Patient presents with  . Left Wrist - Injury, Pain    HPI patient is a pleasant 27 year old gentleman who presents to our clinic today with left wrist pain.  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  He all of a sudden felt a pop and associated pain to the left wrist.  He was seen in the ED where x-rays were obtained and negative for fracture.  He was given a wrist splint and then sent back to work 2 weeks later.  He has since changed jobs and now works as an Personnel officer.  He is right-handed but anytime he is using his left hand he has marked pain.  This is to the entire wrist.  Pain is worse with wrist extension as well as supination.  Of note, this is under Workmen's Comp. from his previous employer which was [employer].  Review of Systems    Constitutional: Negative.   All other systems reviewed and are negative.    Objective: Vital Signs: There were no vitals taken for this visit.  Physical Exam  Constitutional: He is oriented to person, place, and time. He appears well-developed and well-nourished.  HENT:  Head: Normocephalic and atraumatic.  Eyes: Pupils are equal, round, and reactive to light.  Neck: Neck supple.  Pulmonary/Chest: Effort normal.  Abdominal: Soft.  Musculoskeletal: Normal range of motion.  Neurological: He is alert and oriented to person, place, and time.  Skin: Skin is warm.  Psychiatric: He has a normal mood and affect. His behavior is normal. Judgment and thought content normal.  Nursing note and vitals reviewed.   Ortho Exam Left wrist exam shows mild tenderness of the first dorsal wrist compartment.  He has subluxating ECU tendon with wrist supination and pronation. Specialty Comments:  No specialty comments available.  Imaging: Xr Wrist Complete Left  Result Date: 06/21/2017 No acute or structural abnormalities.    PMFS History: Patient Active Problem List   Diagnosis Date Noted  . Perianal fistula 11/01/2012   Past Medical History:  Diagnosis Date  . Perianal fistula     No family history on file.  Past  Surgical History:  Procedure Laterality Date  . EXAMINATION UNDER ANESTHESIA N/A 11/02/2012   Procedure: EXAM UNDER ANESTHESIA;  Surgeon: Atilano Ina, MD;  Location: [facility];  Service: General;  Laterality: N/A;  . EXAMINATION UNDER ANESTHESIA N/A 12/21/2012   Procedure: ANAL EXAM UNDER ANESTHESIA;  Surgeon: Romie Levee, MD;  Location: [facility];  Service: General;  Laterality: N/A;  . IRRIGATION AND DEBRIDEMENT ABSCESS N/A 11/02/2012   Procedure: IRRIGATION AND DEBRIDEMENT RIGHT BUTTOCK BSCESS;  Surgeon: Atilano Ina, MD;  Location[facility];  Service: General;  Laterality: N/A;  . IRRIGATION AND DEBRIDEMENT ABSCESS N/A 12/21/2012   Procedure: IRRIGATION  AND DEBRIDEMENT ;  Surgeon: Romie Levee, MD;  Location: [facility] [place];  Service: General;  Laterality: N/A;  . PLACEMENT OF SETON N/A 11/02/2012   Procedure: PLACEMENT OF SETON;  Surgeon: Atilano Ina, MD;  Location: [facility] OR;  Service: General;  Laterality: N/A;  . PLACEMENT OF SETON N/A 12/21/2012   Procedure: PLACEMENT OF SETON;  Surgeon: Romie Levee, MD;  Location: [facility];  Service: General;  Laterality: N/A;   Social History   Occupational History  . Not on file  Tobacco Use  . Smoking status: Never Smoker  . Smokeless tobacco: Never Used  Substance and Sexual Activity  . Alcohol use: Yes    Alcohol/week: 3.6 oz    Types: 6 Cans of beer per week  . Drug use: No  . Sexual activity: Not on file

## 2017-07-13 ENCOUNTER — Telehealth (INDEPENDENT_AMBULATORY_CARE_PROVIDER_SITE_OTHER): Payer: Self-pay

## 2017-07-13 NOTE — Telephone Encounter (Signed)
Faxed the 06/21/17 office note and work note  to [facility] adj Suzzanne CloudLindsay Duke per her request Fax#[phone] Ph#[phone]
